# Patient Record
Sex: Female | Born: 1935 | Race: White | Hispanic: No | Smoking: Never smoker
Health system: Southern US, Community
[De-identification: ages and names within clinical notes are randomized; demographics above are authoritative.]

## PROBLEM LIST (undated history)

## (undated) DIAGNOSIS — K921 Melena: Secondary | ICD-10-CM

## (undated) DIAGNOSIS — I10 Essential (primary) hypertension: Secondary | ICD-10-CM

## (undated) DIAGNOSIS — M1990 Unspecified osteoarthritis, unspecified site: Secondary | ICD-10-CM

## (undated) DIAGNOSIS — K5792 Diverticulitis of intestine, part unspecified, without perforation or abscess without bleeding: Secondary | ICD-10-CM

## (undated) DIAGNOSIS — R32 Unspecified urinary incontinence: Secondary | ICD-10-CM

## (undated) DIAGNOSIS — K219 Gastro-esophageal reflux disease without esophagitis: Secondary | ICD-10-CM

## (undated) DIAGNOSIS — E079 Disorder of thyroid, unspecified: Secondary | ICD-10-CM

## (undated) HISTORY — PX: TONSILLECTOMY AND ADENOIDECTOMY: SUR1326

## (undated) HISTORY — DX: Gastro-esophageal reflux disease without esophagitis: K21.9

## (undated) HISTORY — PX: REPLACEMENT TOTAL KNEE: SUR1224

## (undated) HISTORY — DX: Essential (primary) hypertension: I10

## (undated) HISTORY — DX: Unspecified osteoarthritis, unspecified site: M19.90

## (undated) HISTORY — PX: CHOLECYSTECTOMY: SHX55

## (undated) HISTORY — DX: Disorder of thyroid, unspecified: E07.9

## (undated) HISTORY — PX: THYROID SURGERY: SHX805

## (undated) HISTORY — PX: APPENDECTOMY: SHX54

## (undated) HISTORY — PX: TUBAL LIGATION: SHX77

## (undated) HISTORY — DX: Melena: K92.1

## (undated) HISTORY — DX: Diverticulitis of intestine, part unspecified, without perforation or abscess without bleeding: K57.92

## (undated) HISTORY — DX: Unspecified urinary incontinence: R32

---

## 2014-10-05 HISTORY — PX: TOTAL SHOULDER REPLACEMENT: SUR1217

## 2015-10-09 ENCOUNTER — Ambulatory Visit (INDEPENDENT_AMBULATORY_CARE_PROVIDER_SITE_OTHER): Payer: Medicare Other | Admitting: Internal Medicine

## 2015-10-09 ENCOUNTER — Other Ambulatory Visit (INDEPENDENT_AMBULATORY_CARE_PROVIDER_SITE_OTHER): Payer: Medicare Other

## 2015-10-09 ENCOUNTER — Encounter: Payer: Self-pay | Admitting: Internal Medicine

## 2015-10-09 VITALS — BP 150/100 | HR 76 | Temp 98.3°F | Resp 16 | Ht 63.0 in | Wt 176.4 lb

## 2015-10-09 DIAGNOSIS — I10 Essential (primary) hypertension: Secondary | ICD-10-CM | POA: Diagnosis not present

## 2015-10-09 DIAGNOSIS — Z23 Encounter for immunization: Secondary | ICD-10-CM | POA: Diagnosis not present

## 2015-10-09 DIAGNOSIS — K219 Gastro-esophageal reflux disease without esophagitis: Secondary | ICD-10-CM

## 2015-10-09 DIAGNOSIS — E039 Hypothyroidism, unspecified: Secondary | ICD-10-CM

## 2015-10-09 LAB — COMPREHENSIVE METABOLIC PANEL
ALT: 18 U/L (ref 0–35)
AST: 18 U/L (ref 0–37)
Albumin: 4.1 g/dL (ref 3.5–5.2)
Alkaline Phosphatase: 67 U/L (ref 39–117)
BILIRUBIN TOTAL: 0.2 mg/dL (ref 0.2–1.2)
BUN: 20 mg/dL (ref 6–23)
CALCIUM: 9.2 mg/dL (ref 8.4–10.5)
CHLORIDE: 105 meq/L (ref 96–112)
CO2: 27 meq/L (ref 19–32)
Creatinine, Ser: 0.6 mg/dL (ref 0.40–1.20)
GFR: 102.69 mL/min (ref >60.00)
Glucose, Bld: 122 mg/dL — ABNORMAL HIGH (ref 70–99)
POTASSIUM: 3.8 meq/L (ref 3.5–5.1)
Sodium: 142 mEq/L (ref 135–145)
Total Protein: 6.5 g/dL (ref 6.0–8.3)

## 2015-10-09 LAB — TSH: TSH: 4.3 u[IU]/mL (ref 0.35–4.50)

## 2015-10-09 LAB — CBC
HEMATOCRIT: 39.7 % (ref 36.0–46.0)
Hemoglobin: 12.9 g/dL (ref 12.0–15.0)
MCHC: 32.6 g/dL (ref 30.0–36.0)
MCV: 87.8 fl (ref 78.0–100.0)
Platelets: 189 10*3/uL (ref 150.0–400.0)
RBC: 4.52 Mil/uL (ref 3.87–5.11)
RDW: 13.5 % (ref 11.5–15.5)
WBC: 7.3 10*3/uL (ref 4.0–10.5)

## 2015-10-09 LAB — HEMOGLOBIN A1C: Hgb A1c MFr Bld: 6 % (ref 4.6–6.5)

## 2015-10-09 LAB — T4, FREE: FREE T4: 0.92 ng/dL (ref 0.60–1.60)

## 2015-10-09 NOTE — Patient Instructions (Signed)
We will have you increase the amlodipine to 10 mg daily. We have sent in a new prescription but you can take 2 pills of the old until it is gone.   We will get the records and check some blood work today.   Health Maintenance, Female Adopting a healthy lifestyle and getting preventive care can go a long way to promote health and wellness. Talk with your health care provider about what schedule of regular examinations is right for you. This is a good chance for you to check in with your provider about disease prevention and staying healthy. In between checkups, there are plenty of things you can do on your own. Experts have done a lot of research about which lifestyle changes and preventive measures are most likely to keep you healthy. Ask your health care provider for more information. WEIGHT AND DIET  Eat a healthy diet  Be sure to include plenty of vegetables, fruits, low-fat dairy products, and lean protein.  Do not eat a lot of foods high in solid fats, added sugars, or salt.  Get regular exercise. This is one of the most important things you can do for your health.  Most adults should exercise for at least 150 minutes each week. The exercise should increase your heart rate and make you sweat (moderate-intensity exercise).  Most adults should also do strengthening exercises at least twice a week. This is in addition to the moderate-intensity exercise.  Maintain a healthy weight  Body mass index (BMI) is a measurement that can be used to identify possible weight problems. It estimates body fat based on height and weight. Your health care provider can help determine your BMI and help you achieve or maintain a healthy weight.  For females 24 years of age and older:   A BMI below 18.5 is considered underweight.  A BMI of 18.5 to 24.9 is normal.  A BMI of 25 to 29.9 is considered overweight.  A BMI of 30 and above is considered obese.  Watch levels of cholesterol and blood  lipids  You should start having your blood tested for lipids and cholesterol at 79 years of age, then have this test every 5 years.  You may need to have your cholesterol levels checked more often if:  Your lipid or cholesterol levels are high.  You are older than 79 years of age.  You are at high risk for heart disease.  CANCER SCREENING   Lung Cancer  Lung cancer screening is recommended for adults 15-60 years old who are at high risk for lung cancer because of a history of smoking.  A yearly low-dose CT scan of the lungs is recommended for people who:  Currently smoke.  Have quit within the past 15 years.  Have at least a 30-pack-year history of smoking. A pack year is smoking an average of one pack of cigarettes a day for 1 year.  Yearly screening should continue until it has been 15 years since you quit.  Yearly screening should stop if you develop a health problem that would prevent you from having lung cancer treatment.  Breast Cancer  Practice breast self-awareness. This means understanding how your breasts normally appear and feel.  It also means doing regular breast self-exams. Let your health care provider know about any changes, no matter how small.  If you are in your 20s or 30s, you should have a clinical breast exam (CBE) by a health care provider every 1-3 years as part of a  regular health exam.  If you are 40 or older, have a CBE every year. Also consider having a breast X-ray (mammogram) every year.  If you have a family history of breast cancer, talk to your health care provider about genetic screening.  If you are at high risk for breast cancer, talk to your health care provider about having an MRI and a mammogram every year.  Breast cancer gene (BRCA) assessment is recommended for women who have family members with BRCA-related cancers. BRCA-related cancers include:  Breast.  Ovarian.  Tubal.  Peritoneal cancers.  Results of the assessment  will determine the need for genetic counseling and BRCA1 and BRCA2 testing. Cervical Cancer Your health care provider may recommend that you be screened regularly for cancer of the pelvic organs (ovaries, uterus, and vagina). This screening involves a pelvic examination, including checking for microscopic changes to the surface of your cervix (Pap test). You may be encouraged to have this screening done every 3 years, beginning at age 84.  For women ages 99-65, health care providers may recommend pelvic exams and Pap testing every 3 years, or they may recommend the Pap and pelvic exam, combined with testing for human papilloma virus (HPV), every 5 years. Some types of HPV increase your risk of cervical cancer. Testing for HPV may also be done on women of any age with unclear Pap test results.  Other health care providers may not recommend any screening for nonpregnant women who are considered low risk for pelvic cancer and who do not have symptoms. Ask your health care provider if a screening pelvic exam is right for you.  If you have had past treatment for cervical cancer or a condition that could lead to cancer, you need Pap tests and screening for cancer for at least 20 years after your treatment. If Pap tests have been discontinued, your risk factors (such as having a new sexual partner) need to be reassessed to determine if screening should resume. Some women have medical problems that increase the chance of getting cervical cancer. In these cases, your health care provider may recommend more frequent screening and Pap tests. Colorectal Cancer  This type of cancer can be detected and often prevented.  Routine colorectal cancer screening usually begins at 79 years of age and continues through 79 years of age.  Your health care provider may recommend screening at an earlier age if you have risk factors for colon cancer.  Your health care provider may also recommend using home test kits to check  for hidden blood in the stool.  A small camera at the end of a tube can be used to examine your colon directly (sigmoidoscopy or colonoscopy). This is done to check for the earliest forms of colorectal cancer.  Routine screening usually begins at age 6.  Direct examination of the colon should be repeated every 5-10 years through 79 years of age. However, you may need to be screened more often if early forms of precancerous polyps or small growths are found. Skin Cancer  Check your skin from head to toe regularly.  Tell your health care provider about any new moles or changes in moles, especially if there is a change in a mole's shape or color.  Also tell your health care provider if you have a mole that is larger than the size of a pencil eraser.  Always use sunscreen. Apply sunscreen liberally and repeatedly throughout the day.  Protect yourself by wearing long sleeves, pants, a wide-brimmed hat,  and sunglasses whenever you are outside. HEART DISEASE, DIABETES, AND HIGH BLOOD PRESSURE   High blood pressure causes heart disease and increases the risk of stroke. High blood pressure is more likely to develop in:  People who have blood pressure in the high end of the normal range (130-139/85-89 mm Hg).  People who are overweight or obese.  People who are African American.  If you are 39-52 years of age, have your blood pressure checked every 3-5 years. If you are 19 years of age or older, have your blood pressure checked every year. You should have your blood pressure measured twice--once when you are at a hospital or clinic, and once when you are not at a hospital or clinic. Record the average of the two measurements. To check your blood pressure when you are not at a hospital or clinic, you can use:  An automated blood pressure machine at a pharmacy.  A home blood pressure monitor.  If you are between 31 years and 2 years old, ask your health care provider if you should take  aspirin to prevent strokes.  Have regular diabetes screenings. This involves taking a blood sample to check your fasting blood sugar level.  If you are at a normal weight and have a low risk for diabetes, have this test once every three years after 79 years of age.  If you are overweight and have a high risk for diabetes, consider being tested at a younger age or more often. PREVENTING INFECTION  Hepatitis B  If you have a higher risk for hepatitis B, you should be screened for this virus. You are considered at high risk for hepatitis B if:  You were born in a country where hepatitis B is common. Ask your health care provider which countries are considered high risk.  Your parents were born in a high-risk country, and you have not been immunized against hepatitis B (hepatitis B vaccine).  You have HIV or AIDS.  You use needles to inject street drugs.  You live with someone who has hepatitis B.  You have had sex with someone who has hepatitis B.  You get hemodialysis treatment.  You take certain medicines for conditions, including cancer, organ transplantation, and autoimmune conditions. Hepatitis C  Blood testing is recommended for:  Everyone born from [birth year] through 1963.  Anyone with known risk factors for hepatitis C. Sexually transmitted infections (STIs)  You should be screened for sexually transmitted infections (STIs) including gonorrhea and chlamydia if:  You are sexually active and are younger than 79 years of age.  You are older than 79 years of age and your health care provider tells you that you are at risk for this type of infection.  Your sexual activity has changed since you were last screened and you are at an increased risk for chlamydia or gonorrhea. Ask your health care provider if you are at risk.  If you do not have HIV, but are at risk, it may be recommended that you take a prescription medicine daily to prevent HIV infection. This is called  pre-exposure prophylaxis (PrEP). You are considered at risk if:  You are sexually active and do not regularly use condoms or know the HIV status of your partner(s).  You take drugs by injection.  You are sexually active with a partner who has HIV. Talk with your health care provider about whether you are at high risk of being infected with HIV. If you choose to begin PrEP, you should  first be tested for HIV. You should then be tested every 3 months for as long as you are taking PrEP.  PREGNANCY   If you are premenopausal and you may become pregnant, ask your health care provider about preconception counseling.  If you may become pregnant, take 400 to 800 micrograms (mcg) of folic acid every day.  If you want to prevent pregnancy, talk to your health care provider about birth control (contraception). OSTEOPOROSIS AND MENOPAUSE   Osteoporosis is a disease in which the bones lose minerals and strength with aging. This can result in serious bone fractures. Your risk for osteoporosis can be identified using a bone density scan.  If you are 2 years of age or older, or if you are at risk for osteoporosis and fractures, ask your health care provider if you should be screened.  Ask your health care provider whether you should take a calcium or vitamin D supplement to lower your risk for osteoporosis.  Menopause may have certain physical symptoms and risks.  Hormone replacement therapy may reduce some of these symptoms and risks. Talk to your health care provider about whether hormone replacement therapy is right for you.  HOME CARE INSTRUCTIONS   Schedule regular health, dental, and eye exams.  Stay current with your immunizations.   Do not use any tobacco products including cigarettes, chewing tobacco, or electronic cigarettes.  If you are pregnant, do not drink alcohol.  If you are breastfeeding, limit how much and how often you drink alcohol.  Limit alcohol intake to no more than 1  drink per day for nonpregnant women. One drink equals 12 ounces of beer, 5 ounces of wine, or 1 ounces of hard liquor.  Do not use street drugs.  Do not share needles.  Ask your health care provider for help if you need support or information about quitting drugs.  Tell your health care provider if you often feel depressed.  Tell your health care provider if you have ever been abused or do not feel safe at home.   This information is not intended to replace advice given to you by your health care provider. Make sure you discuss any questions you have with your health care provider.   Document Released: 04/06/2011 Document Revised: 10/12/2014 Document Reviewed: 08/23/2013 Elsevier Interactive Patient Education [employer].

## 2015-10-09 NOTE — Progress Notes (Signed)
Pre visit review using our clinic review tool, if applicable. No additional management support is needed unless otherwise documented below in the visit note. 

## 2015-10-11 DIAGNOSIS — I10 Essential (primary) hypertension: Secondary | ICD-10-CM | POA: Insufficient documentation

## 2015-10-11 DIAGNOSIS — K219 Gastro-esophageal reflux disease without esophagitis: Secondary | ICD-10-CM | POA: Insufficient documentation

## 2015-10-11 DIAGNOSIS — E039 Hypothyroidism, unspecified: Secondary | ICD-10-CM | POA: Insufficient documentation

## 2015-10-11 MED ORDER — AMLODIPINE BESYLATE 10 MG PO TABS: 10.0000 mg | ORAL_TABLET | Freq: Every day | ORAL | Status: DC

## 2015-10-11 NOTE — Assessment & Plan Note (Signed)
Takes omeprazole daily which is keeping her symptoms under good control. If she eats certain things she still gets symptoms.

## 2015-10-11 NOTE — Assessment & Plan Note (Signed)
BP not at goal, getting records from prior PCP. Checking CMP today and increasing amlodipine to 10 mg, continue metoprolol 50 mg BID, and ramipril 5 mg daily. See her back soon and adjust as needed.

## 2015-10-11 NOTE — Progress Notes (Signed)
   Subjective:    Patient ID: Ashlee Weeks, female    DOB: [birth year], 81 y.o.   MRN: [id number]  HPI The patient is a new 81 YO female coming in for her blood pressure. She has been seeing a doctor but recently moved to the area. Denies headaches, chest pains, SOB. She denies any side effects from the medicines. Not sure if she has any complications.   PMH, [facility], social history reviewed and updated.   Review of Systems  Constitutional: Negative for fever, activity change, appetite change, fatigue and unexpected weight change.  HENT: Negative.   Eyes: Negative.   Respiratory: Negative for cough, chest tightness, shortness of breath and wheezing.   Cardiovascular: Negative for chest pain, palpitations and leg swelling.  Gastrointestinal: Negative for nausea, abdominal pain, diarrhea, constipation and abdominal distention.  Musculoskeletal: Negative.   Skin: Negative.   Neurological: Negative.   Psychiatric/Behavioral: Negative.       Objective:   Physical Exam  Constitutional: She is oriented to person, place, and time. She appears well-developed and well-nourished.  HENT:  Head: Normocephalic and atraumatic.  Eyes: EOM are normal.  Neck: Normal range of motion.  Cardiovascular: Normal rate and regular rhythm.   Pulmonary/Chest: Effort normal and breath sounds normal. No respiratory distress. She has no wheezes. She has no rales.  Abdominal: Soft. Bowel sounds are normal. She exhibits no distension. There is no tenderness. There is no rebound.  Musculoskeletal: She exhibits no edema.  Neurological: She is alert and oriented to person, place, and time. Coordination normal.  Skin: Skin is warm and dry.  Psychiatric: She has a normal mood and affect.   Filed Vitals:   10/09/15 1031  BP: 150/100  Pulse: 76  Temp: 98.3 F (36.8 C)  TempSrc: Oral  Resp: 16  Height: 5\' 3"  (1.6 m)  Weight: 176 lb 6.4 oz (80.015 kg)  SpO2: 97%      Assessment & Plan:  Given flu shot and  prevnar 13 at visit.

## 2015-10-11 NOTE — Assessment & Plan Note (Signed)
Had lobectomy for benign cyst in the past. Taking synthroid 25 mcg daily. Checking TSH and free T4 and adjust as needed.

## 2015-11-15 ENCOUNTER — Telehealth: Payer: Self-pay | Admitting: *Deleted

## 2015-11-15 MED ORDER — METOPROLOL TARTRATE 50 MG PO TABS: 50.0000 mg | ORAL_TABLET | Freq: Two times a day (BID) | ORAL | Status: DC

## 2015-11-15 NOTE — Telephone Encounter (Signed)
Receive call pt states she is needing refill on her metoprolol. verified pharmacy inform pt will send to [place]...Johny Chess

## 2015-12-06 ENCOUNTER — Other Ambulatory Visit: Payer: Self-pay | Admitting: Internal Medicine

## 2015-12-18 ENCOUNTER — Telehealth: Payer: Self-pay | Admitting: Internal Medicine

## 2015-12-18 NOTE — Telephone Encounter (Signed)
Received records from [facility] forwarded 36 pages to Dr. Pricilla Holm 12/18/15 fbg.

## 2016-01-07 HISTORY — PX: EYE SURGERY: SHX253

## 2016-01-23 HISTORY — PX: EYE SURGERY: SHX253

## 2016-01-30 ENCOUNTER — Encounter: Payer: Self-pay | Admitting: Internal Medicine

## 2016-01-30 ENCOUNTER — Ambulatory Visit (INDEPENDENT_AMBULATORY_CARE_PROVIDER_SITE_OTHER): Payer: Medicare Other | Admitting: Internal Medicine

## 2016-01-30 VITALS — BP 132/78 | HR 88 | Temp 98.5°F | Resp 20 | Ht 63.0 in | Wt 175.0 lb

## 2016-01-30 DIAGNOSIS — Z23 Encounter for immunization: Secondary | ICD-10-CM

## 2016-01-30 DIAGNOSIS — I10 Essential (primary) hypertension: Secondary | ICD-10-CM | POA: Diagnosis not present

## 2016-01-30 MED ORDER — OMEPRAZOLE 20 MG PO CPDR
20.0000 mg | DELAYED_RELEASE_CAPSULE | Freq: Every day | ORAL | Status: DC
Start: 2016-01-30 — End: 2017-02-04

## 2016-01-30 NOTE — Progress Notes (Signed)
Pre visit review using our clinic review tool, if applicable. No additional management support is needed unless otherwise documented below in the visit note. 

## 2016-01-30 NOTE — Patient Instructions (Signed)
We have given you the tetanus shot today and do not need blood work.   Come back in about 6 months for a follow up and please feel free to call us with problems or questions before then.

## 2016-01-30 NOTE — Assessment & Plan Note (Signed)
BP at goal now on amlodipine 10 mg daily, metoprolol 50 mg BID and ramipril 5 mg BID. No indication to recheck BMP today. Advised to continue.

## 2016-01-30 NOTE — Progress Notes (Signed)
   Subjective:    Patient ID: Ashlee Weeks, female    DOB: 1935-05-08, 81 y.o.   MRN: [id number]  HPI The patient is a 81 YO female coming in for follow up of her blood pressure. Increased her amlodipine to 10 mg at last visit. She has made that change without problems. Some mild ankle swelling which does not bother her. No headaches, chest pains, SOB. BPs are better at home. No new problems.   Review of Systems  Constitutional: Negative for fever, activity change, appetite change, fatigue and unexpected weight change.  Respiratory: Negative for cough, chest tightness, shortness of breath and wheezing.   Cardiovascular: Negative for chest pain, palpitations and leg swelling.  Gastrointestinal: Negative for nausea, abdominal pain, diarrhea, constipation and abdominal distention.  Musculoskeletal: Negative.   Skin: Negative.   Neurological: Negative.   Psychiatric/Behavioral: Negative.       Objective:   Physical Exam  Constitutional: She is oriented to person, place, and time. She appears well-developed and well-nourished.  HENT:  Head: Normocephalic and atraumatic.  Eyes: EOM are normal.  Neck: Normal range of motion.  Cardiovascular: Normal rate and regular rhythm.   Pulmonary/Chest: Effort normal and breath sounds normal. No respiratory distress. She has no wheezes. She has no rales.  Abdominal: Soft. She exhibits no distension. There is no tenderness. There is no rebound.  Musculoskeletal: She exhibits no edema.  Neurological: She is alert and oriented to person, place, and time. Coordination normal.  Skin: Skin is warm and dry.   Filed Vitals:   01/30/16 1312  BP: 132/78  Pulse: 88  Temp: 98.5 F (36.9 C)  TempSrc: Oral  Resp: 20  Height: 5\' 3"  (1.6 m)  Weight: 175 lb (79.379 kg)  SpO2: 92%      Assessment & Plan:  Tdap given at visit.

## 2016-02-06 ENCOUNTER — Ambulatory Visit: Payer: Medicare Other | Admitting: Internal Medicine

## 2016-05-14 ENCOUNTER — Other Ambulatory Visit: Payer: Self-pay | Admitting: Internal Medicine

## 2016-07-30 ENCOUNTER — Encounter: Payer: Self-pay | Admitting: Internal Medicine

## 2016-07-30 ENCOUNTER — Other Ambulatory Visit (INDEPENDENT_AMBULATORY_CARE_PROVIDER_SITE_OTHER): Payer: Medicare Other

## 2016-07-30 ENCOUNTER — Ambulatory Visit (INDEPENDENT_AMBULATORY_CARE_PROVIDER_SITE_OTHER): Payer: Medicare Other | Admitting: Internal Medicine

## 2016-07-30 VITALS — BP 164/94 | HR 86 | Temp 99.0°F | Resp 18 | Ht 63.0 in | Wt 182.8 lb

## 2016-07-30 DIAGNOSIS — Z23 Encounter for immunization: Secondary | ICD-10-CM

## 2016-07-30 DIAGNOSIS — E039 Hypothyroidism, unspecified: Secondary | ICD-10-CM

## 2016-07-30 DIAGNOSIS — I10 Essential (primary) hypertension: Secondary | ICD-10-CM | POA: Diagnosis not present

## 2016-07-30 LAB — TSH: TSH: 2.64 u[IU]/mL (ref 0.35–4.50)

## 2016-07-30 NOTE — Progress Notes (Signed)
   Subjective:    Patient ID: Ashlee Weeks, female    DOB: [birth year], 81 y.o.   MRN: [id number]  HPI The patient is a 81 YO female coming in for follow up of her blood pressure (she is taking amlodipine and metoprolol and ramipril, usually at goal). She did not take her meds today as she thought we may need labs. She did not eat this morning. Some increase in her sodium intake recently with more sandwiches and soups. She denies headaches or chest pains or abdominal pain. No otc pain meds or cold medications recently.   Review of Systems  Constitutional: Negative.   Respiratory: Negative.   Cardiovascular: Negative.   Gastrointestinal: Negative.   Musculoskeletal: Positive for arthralgias. Negative for gait problem and myalgias.  Skin: Negative.   Neurological: Negative.       Objective:   Physical Exam  Constitutional: She is oriented to person, place, and time. She appears well-developed and well-nourished.  HENT:  Head: Normocephalic and atraumatic.  Eyes: EOM are normal.  Cardiovascular: Normal rate and regular rhythm.   Pulmonary/Chest: Effort normal. No respiratory distress. She has no wheezes. She has no rales.  Abdominal: Soft. She exhibits no distension. There is no tenderness. There is no rebound.  Neurological: She is alert and oriented to person, place, and time.  Skin: Skin is warm.   Vitals:   07/30/16 1057 07/30/16 1126  BP: (!) 158/88 (!) 164/94  Pulse: 86   Resp: 18   Temp: 99 F (37.2 C)   TempSrc: Oral   SpO2: 95%   Weight: 182 lb 12.8 oz (82.9 kg)   Height: 5\' 3"  (1.6 m)       Assessment & Plan:  Flu shot given at visit.

## 2016-07-30 NOTE — Patient Instructions (Signed)
We have given you the flu shot today.   We are checking the thyroid levels and will call you back about the results.   Try some metamucil to help with the bowels.

## 2016-07-30 NOTE — Progress Notes (Signed)
Pre visit review using our clinic review tool, if applicable. No additional management support is needed unless otherwise documented below in the visit note. 

## 2016-08-02 NOTE — Assessment & Plan Note (Signed)
BP is mildly elevated today which is unusual. Talked to her about dash diet and decreasing sodium intake. She will take her meds before coming next visit. Adjust as needed then. Taking amlodipine, ramipril and metoprolol. Dose adjustment on the ramipril could be done next visit.

## 2016-08-02 NOTE — Assessment & Plan Note (Signed)
Checking TSH and adjust as needed. Taking synthroid 25 mcg daily and do not want over replacement.

## 2016-08-18 ENCOUNTER — Other Ambulatory Visit: Payer: Self-pay | Admitting: Internal Medicine

## 2016-08-19 ENCOUNTER — Other Ambulatory Visit: Payer: Self-pay | Admitting: Internal Medicine

## 2016-10-13 ENCOUNTER — Other Ambulatory Visit: Payer: Self-pay | Admitting: Internal Medicine

## 2016-11-09 ENCOUNTER — Other Ambulatory Visit: Payer: Self-pay | Admitting: Internal Medicine

## 2016-11-16 ENCOUNTER — Other Ambulatory Visit: Payer: Self-pay | Admitting: Internal Medicine

## 2016-11-18 ENCOUNTER — Other Ambulatory Visit: Payer: Self-pay | Admitting: *Deleted

## 2016-11-18 MED ORDER — LEVOTHYROXINE SODIUM 25 MCG PO TABS: 25.0000 ug | ORAL_TABLET | Freq: Every day | ORAL | 2 refills | Status: DC

## 2017-01-19 ENCOUNTER — Other Ambulatory Visit: Payer: Self-pay | Admitting: Internal Medicine

## 2017-02-04 ENCOUNTER — Other Ambulatory Visit: Payer: Self-pay | Admitting: Internal Medicine

## 2017-02-14 ENCOUNTER — Other Ambulatory Visit: Payer: Self-pay | Admitting: Internal Medicine

## 2017-03-08 ENCOUNTER — Encounter: Payer: Self-pay | Admitting: Internal Medicine

## 2017-03-08 ENCOUNTER — Ambulatory Visit (INDEPENDENT_AMBULATORY_CARE_PROVIDER_SITE_OTHER): Payer: Medicare Other | Admitting: Internal Medicine

## 2017-03-08 VITALS — BP 170/92 | HR 93 | Temp 98.8°F | Resp 14 | Ht 63.0 in | Wt 180.0 lb

## 2017-03-08 DIAGNOSIS — I10 Essential (primary) hypertension: Secondary | ICD-10-CM | POA: Diagnosis not present

## 2017-03-08 MED ORDER — RAMIPRIL 10 MG PO CAPS: 10.0000 mg | ORAL_CAPSULE | Freq: Two times a day (BID) | ORAL | 3 refills | Status: DC

## 2017-03-08 NOTE — Progress Notes (Signed)
   Subjective:    Patient ID: Ashlee Weeks, female    DOB: 1935/07/29, 82 y.o.   MRN: [id number]  HPI The patient is a 82 YO female coming in for dizziness in the morning for several days with blood pressure running higher than it should for several months. She has been having some mildly elevated readings to 150s/90s a lot over the last several months. In the last week she has been eating more salty foods and her BP in the morning has been in the 170s mostly. She felt dizzy with standing for a few minutes. Then okay most of the day. Denies headaches, chest pains, fevers or chills, abdominal pain, nausea or vomiting. Not drinking as much water lately as she should. Has had vertigo in the past and it did not feel like this. Felt more like she might pass out although she denies falls or syncope.   Review of Systems  Constitutional: Positive for appetite change. Negative for activity change, diaphoresis, fatigue and unexpected weight change.  Respiratory: Negative.   Cardiovascular: Negative.   Gastrointestinal: Negative.   Musculoskeletal: Negative.   Neurological: Positive for light-headedness. Negative for dizziness.      Objective:   Physical Exam  Constitutional: She is oriented to person, place, and time. She appears well-developed and well-nourished.  HENT:  Head: Normocephalic and atraumatic.  Eyes: EOM are normal.  Neck: Normal range of motion.  Cardiovascular: Normal rate and regular rhythm.   Pulmonary/Chest: Effort normal and breath sounds normal.  Abdominal: Soft.  Musculoskeletal: She exhibits no edema.  Neurological: She is alert and oriented to person, place, and time. No cranial nerve deficit. Coordination normal.  No orthostatic symptoms with standing.   Skin: Skin is warm and dry.   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

## 2017-03-08 NOTE — Patient Instructions (Addendum)
The EKG of the heart is not changed from before.   We should get the blood pressure down some to help decrease the risk of heart attack and stroke.   We will increase the ramipril to 10 mg in the morning and 10 mg in the evening to see if this helps with the dizziness. You can take 2 pills of the current ramipril until it is gone and then I have sent in the higher dose to the pharmacy.   We will have you come back in about 1 month for physical and follow up to check on the blood pressure and check the labs.

## 2017-03-10 NOTE — Assessment & Plan Note (Signed)
EKG done in office due to symptoms and high BP which is negative for acute changes. Increase ramipril to 20 mg daily, continue metoprolol 50 mg BID, amlodipine 10 mg daily.

## 2017-03-14 ENCOUNTER — Other Ambulatory Visit: Payer: Self-pay | Admitting: Internal Medicine

## 2017-03-29 ENCOUNTER — Encounter: Payer: Self-pay | Admitting: Nurse Practitioner

## 2017-03-29 ENCOUNTER — Other Ambulatory Visit (INDEPENDENT_AMBULATORY_CARE_PROVIDER_SITE_OTHER): Payer: Medicare Other

## 2017-03-29 ENCOUNTER — Ambulatory Visit (INDEPENDENT_AMBULATORY_CARE_PROVIDER_SITE_OTHER): Payer: Medicare Other | Admitting: Nurse Practitioner

## 2017-03-29 VITALS — BP 140/76 | HR 91 | Temp 98.2°F | Ht 63.0 in | Wt 182.0 lb

## 2017-03-29 DIAGNOSIS — M810 Age-related osteoporosis without current pathological fracture: Secondary | ICD-10-CM | POA: Diagnosis not present

## 2017-03-29 DIAGNOSIS — I10 Essential (primary) hypertension: Secondary | ICD-10-CM | POA: Diagnosis not present

## 2017-03-29 DIAGNOSIS — K219 Gastro-esophageal reflux disease without esophagitis: Secondary | ICD-10-CM

## 2017-03-29 DIAGNOSIS — E039 Hypothyroidism, unspecified: Secondary | ICD-10-CM

## 2017-03-29 LAB — TSH: TSH: 3.51 u[IU]/mL (ref 0.35–4.50)

## 2017-03-29 LAB — T4, FREE: FREE T4: 0.94 ng/dL (ref 0.60–1.60)

## 2017-03-29 LAB — BASIC METABOLIC PANEL
BUN: 19 mg/dL (ref 6–23)
CALCIUM: 9.5 mg/dL (ref 8.4–10.5)
CO2: 28 mEq/L (ref 19–32)
CREATININE: 0.67 mg/dL (ref 0.40–1.20)
Chloride: 105 mEq/L (ref 96–112)
GFR: 90.07 mL/min (ref >60.00)
GLUCOSE: 154 mg/dL — AB (ref 70–99)
Potassium: 4 mEq/L (ref 3.5–5.1)
Sodium: 141 mEq/L (ref 135–145)

## 2017-03-29 MED ORDER — OMEPRAZOLE 20 MG PO CPDR: 20.0000 mg | DELAYED_RELEASE_CAPSULE | Freq: Every day | ORAL | 3 refills | Status: DC

## 2017-03-29 MED ORDER — LEVOTHYROXINE SODIUM 25 MCG PO TABS: 25.0000 ug | ORAL_TABLET | Freq: Every day | ORAL | 3 refills | Status: DC

## 2017-03-29 MED ORDER — METOPROLOL TARTRATE 50 MG PO TABS: 50.0000 mg | ORAL_TABLET | Freq: Two times a day (BID) | ORAL | 1 refills | Status: DC

## 2017-03-29 NOTE — Patient Instructions (Addendum)
Go to basement for blood draw.  Will review lab prior to refilling levothyroxine.  Make appt for bone density scan. Consider changing from evista to prolia if dexa scan indicates no improvement in bone density.

## 2017-03-29 NOTE — Progress Notes (Signed)
Subjective:  Patient ID: Ashlee Weeks, female    DOB: 12-Nov-1934  Age: 82 y.o. MRN: [id number]  CC: Follow-up (HTN and thyroid function)   HPI Declined CPE today.  HTN: Improved with amlodipine, metoprolol, and ramipril.  Hypothyroidism: Stable.  Osteoporosis: Current use of evista No adverse side effects. Use of fosamax in past, caused joint and muscle pain.  Outpatient Medications Prior to Visit  Medication Sig Dispense Refill  . amLODipine (NORVASC) 10 MG tablet TAKE 1 TABLET BY MOUTH EVERY DAY 90 tablet 3  . aspirin EC 81 MG tablet Take 81 mg by mouth daily.    . Calcium Carbonate-Vitamin D (CALTRATE 600+D PO) Take by mouth.    . Cholecalciferol (VITAMIN D-3) 1000 units CAPS Take by mouth. Take one by mouth twice daily    . clindamycin (CLEOCIN T) 1 % lotion Apply topically daily.     . cyanocobalamin 1000 MCG tablet Take 100 mcg by mouth daily.    Marland Kitchen doxycycline (VIBRA-TABS) 100 MG tablet Take 50 mg by mouth daily.     Marland Kitchen ibuprofen (ADVIL,MOTRIN) 200 MG tablet Take 200 mg by mouth every 6 (six) hours as needed.    . Multiple Vitamins-Minerals (MULTIVITAMIN WITH MINERALS) tablet Take 1 tablet by mouth daily.    . raloxifene (EVISTA) 60 MG tablet TAKE 1 TABLET BY MOUTH DAILY 90 tablet 2  . ramipril (ALTACE) 10 MG capsule Take 1 capsule (10 mg total) by mouth 2 (two) times daily. 60 capsule 3  . levothyroxine (SYNTHROID, LEVOTHROID) 25 MCG tablet Take 1 tablet (25 mcg total) by mouth daily before breakfast. 90 tablet 2  . metoprolol (LOPRESSOR) 50 MG tablet TAKE 1 TABLET BY MOUTH TWICE A DAY 180 tablet 1  . omeprazole (PRILOSEC) 20 MG capsule TAKE 1 CAPSULE (20 MG TOTAL) BY MOUTH DAILY. 90 capsule 1   No facility-administered medications prior to visit.     ROS See HPI  Objective:  BP 140/76   Pulse 91   Temp 98.2 F (36.8 C)   Ht 5\' 3"  (1.6 m)   Wt 182 lb (82.6 kg)   SpO2 96%   BMI 32.24 kg/m   BP Readings from Last 3 Encounters:  03/29/17 140/76  03/08/17  (!) 170/92  07/30/16 (!) 164/94    Wt Readings from Last 3 Encounters:  03/29/17 182 lb (82.6 kg)  03/08/17 180 lb (81.6 kg)  07/30/16 182 lb 12.8 oz (82.9 kg)    Physical Exam  Constitutional: She is oriented to person, place, and time. No distress.  HENT:  Right Ear: External ear normal.  Left Ear: External ear normal.  Nose: Nose normal.  Mouth/Throat: No oropharyngeal exudate.  Neck: Normal range of motion. Neck supple. No thyromegaly present.  Cardiovascular: Normal rate, regular rhythm and normal heart sounds.   Pulmonary/Chest: Effort normal and breath sounds normal. No respiratory distress.  Abdominal: Soft. She exhibits no distension.  Musculoskeletal: Normal range of motion. She exhibits no edema.  Lymphadenopathy:    She has no cervical adenopathy.  Neurological: She is alert and oriented to person, place, and time.  Skin: Skin is warm and dry.  Psychiatric: She has a normal mood and affect. Her behavior is normal.  Vitals reviewed.   Lab Results  Component Value Date   WBC 7.3 10/09/2015   HGB 12.9 10/09/2015   HCT 39.7 10/09/2015   PLT 189.0 10/09/2015   GLUCOSE 154 (H) 03/29/2017   ALT 18 10/09/2015   AST 18 10/09/2015   NA  141 03/29/2017   K 4.0 03/29/2017   CL 105 03/29/2017   CREATININE 0.67 03/29/2017   BUN 19 03/29/2017   CO2 28 03/29/2017   TSH 3.51 03/29/2017   HGBA1C 6.0 10/09/2015    Patient was never admitted.  Assessment & Plan:   Naysha was seen today for follow-up.  Diagnoses and all orders for this visit:  Essential hypertension -     Basic metabolic panel; Future -     metoprolol tartrate (LOPRESSOR) 50 MG tablet; Take 1 tablet (50 mg total) by mouth 2 (two) times daily. -     DG Bone Density; Future  Gastroesophageal reflux disease, esophagitis presence not specified -     omeprazole (PRILOSEC) 20 MG capsule; Take 1 capsule (20 mg total) by mouth daily.  Hypothyroidism, unspecified type -     TSH; Future -     T4, free;  Future -     levothyroxine (SYNTHROID, LEVOTHROID) 25 MCG tablet; Take 1 tablet (25 mcg total) by mouth daily before breakfast.  Age-related osteoporosis without current pathological fracture -     DG Bone Density; Future   I have changed Ms. Flori's metoprolol tartrate. I am also having her maintain her Vitamin D-3, cyanocobalamin, aspirin EC, multivitamin with minerals, Calcium Carbonate-Vitamin D (CALTRATE 600+D PO), ibuprofen, doxycycline, clindamycin, amLODipine, ramipril, raloxifene, levothyroxine, and omeprazole.  Meds ordered this encounter  Medications  . metoprolol tartrate (LOPRESSOR) 50 MG tablet    Sig: Take 1 tablet (50 mg total) by mouth 2 (two) times daily.    Dispense:  180 tablet    Refill:  1    Order Specific Question:   Supervising Provider    Answer:   Cassandria Anger [[id number]]  . levothyroxine (SYNTHROID, LEVOTHROID) 25 MCG tablet    Sig: Take 1 tablet (25 mcg total) by mouth daily before breakfast.    Dispense:  90 tablet    Refill:  3    Order Specific Question:   Supervising Provider    Answer:   Cassandria Anger [[id number]]  . omeprazole (PRILOSEC) 20 MG capsule    Sig: Take 1 capsule (20 mg total) by mouth daily.    Dispense:  90 capsule    Refill:  3    Order Specific Question:   Supervising Provider    Answer:   Cassandria Anger [[id number]]    Follow-up: Return in about 6 months (around 09/28/2017) for HTN and hypothyroidism.  Wilfred Lacy, NP

## 2017-04-08 ENCOUNTER — Inpatient Hospital Stay: Admission: RE | Admit: 2017-04-08 | Payer: Medicare Other | Source: Ambulatory Visit

## 2017-06-28 ENCOUNTER — Other Ambulatory Visit: Payer: Self-pay | Admitting: Internal Medicine

## 2017-07-16 ENCOUNTER — Ambulatory Visit (INDEPENDENT_AMBULATORY_CARE_PROVIDER_SITE_OTHER): Payer: Medicare Other | Admitting: *Deleted

## 2017-07-16 VITALS — BP 134/80 | HR 74 | Resp 20 | Ht 63.0 in | Wt 176.0 lb

## 2017-07-16 DIAGNOSIS — E2839 Other primary ovarian failure: Secondary | ICD-10-CM | POA: Diagnosis not present

## 2017-07-16 DIAGNOSIS — Z0000 Encounter for general adult medical examination without abnormal findings: Secondary | ICD-10-CM | POA: Diagnosis not present

## 2017-07-16 DIAGNOSIS — Z23 Encounter for immunization: Secondary | ICD-10-CM | POA: Diagnosis not present

## 2017-07-16 NOTE — Patient Instructions (Signed)
Continue doing brain stimulating activities (puzzles, reading, adult coloring books, staying active) to keep memory sharp.   Continue to eat heart healthy diet (full of fruits, vegetables, whole grains, lean protein, water--limit salt, fat, and sugar intake) and increase physical activity as tolerated.   Ashlee Weeks , Thank you for taking time to come for your Medicare Wellness Visit. I appreciate your ongoing commitment to your health goals. Please review the following plan we discussed and let me know if I can assist you in the future.   These are the goals we discussed: Goals    . Stay as healthy as I can          Continue to walk, eat healthy, stay informed, enjoy life and family,        This is a list of the screening recommended for you and due dates:  Health Maintenance  Topic Date Due  . DEXA scan (bone density measurement)  06/28/2002  . Flu Shot  05/05/2017  . Tetanus Vaccine  01/29/2026  . Pneumonia vaccines  Completed   Influenza Virus Vaccine injection What is this medicine? INFLUENZA VIRUS VACCINE (in floo EN zuh VAHY ruhs vak SEEN) helps to reduce the risk of getting influenza also known as the flu. The vaccine only helps protect you against some strains of the flu. This medicine may be used for other purposes; ask your health care provider or pharmacist if you have questions. COMMON BRAND NAME(S): Afluria, Agriflu, Alfuria, FLUAD, Fluarix, Fluarix Quadrivalent, Flublok, Flublok Quadrivalent, FLUCELVAX, Flulaval, Fluvirin, Fluzone, Fluzone High-Dose, Fluzone Intradermal What should I tell my health care provider before I take this medicine? They need to know if you have any of these conditions: -bleeding disorder like hemophilia -fever or infection -Guillain-Barre syndrome or other neurological problems -immune system problems -infection with the human immunodeficiency virus (HIV) or AIDS -low blood platelet counts -multiple sclerosis -an unusual or allergic  reaction to influenza virus vaccine, latex, other medicines, foods, dyes, or preservatives. Different brands of vaccines contain different allergens. Some may contain latex or eggs. Talk to your doctor about your allergies to make sure that you get the right vaccine. -pregnant or trying to get pregnant -breast-feeding How should I use this medicine? This vaccine is for injection into a muscle or under the skin. It is given by a health care professional. A copy of Vaccine Information Statements will be given before each vaccination. Read this sheet carefully each time. The sheet may change frequently. Talk to your healthcare provider to see which vaccines are right for you. Some vaccines should not be used in all age groups. Overdosage: If you think you have taken too much of this medicine contact a poison control center or emergency room at once. NOTE: This medicine is only for you. Do not share this medicine with others. What if I miss a dose? This does not apply. What may interact with this medicine? -chemotherapy or radiation therapy -medicines that lower your immune system like etanercept, anakinra, infliximab, and adalimumab -medicines that treat or prevent blood clots like warfarin -phenytoin -steroid medicines like prednisone or cortisone -theophylline -vaccines This list may not describe all possible interactions. Give your health care provider a list of all the medicines, herbs, non-prescription drugs, or dietary supplements you use. Also tell them if you smoke, drink alcohol, or use illegal drugs. Some items may interact with your medicine. What should I watch for while using this medicine? Report any side effects that do not go away within 3  days to your doctor or health care professional. Call your health care provider if any unusual symptoms occur within 6 weeks of receiving this vaccine. You may still catch the flu, but the illness is not usually as bad. You cannot get the flu  from the vaccine. The vaccine will not protect against colds or other illnesses that may cause fever. The vaccine is needed every year. What side effects may I notice from receiving this medicine? Side effects that you should report to your doctor or health care professional as soon as possible: -allergic reactions like skin rash, itching or hives, swelling of the face, lips, or tongue Side effects that usually do not require medical attention (report to your doctor or health care professional if they continue or are bothersome): -fever -headache -muscle aches and pains -pain, tenderness, redness, or swelling at the injection site -tiredness This list may not describe all possible side effects. Call your doctor for medical advice about side effects. You may report side effects to FDA at 1-800-FDA-1088. Where should I keep my medicine? The vaccine will be given by a health care professional in a clinic, pharmacy, doctor's office, or other health care setting. You will not be given vaccine doses to store at home. NOTE: This sheet is a summary. It may not cover all possible information. If you have questions about this medicine, talk to your doctor, pharmacist, or health care provider.  2018 Elsevier/Gold Standard (2015-04-12 10:07:28)

## 2017-07-16 NOTE — Progress Notes (Signed)
Pre visit review using our clinic review tool, if applicable. No additional management support is needed unless otherwise documented below in the visit note. 

## 2017-07-16 NOTE — Progress Notes (Signed)
Subjective:   Ashlee Weeks is a 82 y.o. female who presents for an Initial Medicare Annual Wellness Visit.  Review of Systems    No ROS.  Medicare Wellness Visit. Additional risk factors are reflected in the social history.   Cardiac Risk Factors include: advanced age (>79men, >35 women);hypertension Sleep patterns: feels rested on waking, gets up 1-2 times nightly to void and sleeps 6-7 hours nightly.    Home Safety/Smoke Alarms: Feels safe in home. Smoke alarms in place.  Living environment; residence and Firearm Safety: 1-story house/ trailer, no firearms. Lives alone, no needs for DME, good support system Seat Belt Safety/Bike Helmet: Wears seat belt.    Objective:    Today's Vitals   07/16/17 1507  BP: 134/80  Pulse: 74  Resp: 20  SpO2: 96%  Weight: 176 lb (79.8 kg)  Height: 5\' 3"  (1.6 m)   Body mass index is 31.18 kg/m.   Current Medications (verified) Outpatient Encounter Prescriptions as of 07/16/2017  Medication Sig  . amLODipine (NORVASC) 10 MG tablet TAKE 1 TABLET BY MOUTH EVERY DAY  . aspirin EC 81 MG tablet Take 81 mg by mouth daily.  . Calcium Carbonate-Vitamin D (CALTRATE 600+D PO) Take by mouth.   . Cholecalciferol (VITAMIN D-3) 1000 units CAPS Take by mouth. Take one by mouth twice daily  . clindamycin (CLEOCIN T) 1 % lotion Apply topically daily.   . cyanocobalamin 1000 MCG tablet Take 100 mcg by mouth daily.  Marland Kitchen doxycycline (VIBRA-TABS) 100 MG tablet Take 50 mg by mouth daily.   Marland Kitchen ibuprofen (ADVIL,MOTRIN) 200 MG tablet Take 200 mg by mouth every 6 (six) hours as needed.  Marland Kitchen levothyroxine (SYNTHROID, LEVOTHROID) 25 MCG tablet Take 1 tablet (25 mcg total) by mouth daily before breakfast.  . metoprolol tartrate (LOPRESSOR) 50 MG tablet Take 1 tablet (50 mg total) by mouth 2 (two) times daily.  . Multiple Vitamins-Minerals (MULTIVITAMIN WITH MINERALS) tablet Take 1 tablet by mouth daily.  Marland Kitchen omeprazole (PRILOSEC) 20 MG capsule Take 1 capsule (20 mg total)  by mouth daily.  . raloxifene (EVISTA) 60 MG tablet TAKE 1 TABLET BY MOUTH DAILY  . ramipril (ALTACE) 10 MG capsule TAKE ONE CAPSULE BY MOUTH TWICE A DAY   No facility-administered encounter medications on file as of 07/16/2017.     Allergies (verified) Fosamax [alendronate sodium]   History: Past Medical History:  Diagnosis Date  . Arthritis   . Blood in stool   . Diverticulitis   . GERD (gastroesophageal reflux disease)   . Hypertension   . Thyroid disease   . Urine incontinence    Past Surgical History:  Procedure Laterality Date  . APPENDECTOMY    . CHOLECYSTECTOMY    . EYE SURGERY Right 01/07/2016   artificial lens implant  . EYE SURGERY Left 01/23/2016   artificial lens implant  . REPLACEMENT TOTAL KNEE Bilateral   . THYROID SURGERY     1/2 of thyroid removed  . TONSILLECTOMY AND ADENOIDECTOMY    . TOTAL SHOULDER REPLACEMENT Left 2016  . TUBAL LIGATION     Family History  Problem Relation Age of Onset  . Hypertension Mother   . Heart disease Father   . Hypertension Maternal Aunt   . Miscarriages / Stillbirths Maternal Aunt   . Arthritis Maternal Grandmother   . Alcohol abuse Paternal Grandmother    Social History   Occupational History  . Not on file.   Social History Main Topics  . Smoking status: Never Smoker  .  Smokeless tobacco: Never Used  . Alcohol use No  . Drug use: No  . Sexual activity: Not on file    Tobacco Counseling Counseling given: Not Answered   Activities of Daily Living In your present state of health, do you have any difficulty performing the following activities: 07/16/2017  Hearing? N  Vision? N  Difficulty concentrating or making decisions? N  Walking or climbing stairs? N  Dressing or bathing? N  Doing errands, shopping? N  Preparing Food and eating ? N  Using the Toilet? N  In the past six months, have you accidently leaked urine? Y  Do you have problems with loss of bowel control? N  Managing your Medications? N   Managing your Finances? N  Housekeeping or managing your Housekeeping? N  Some recent data might be hidden    Immunizations and Health Maintenance Immunization History  Administered Date(s) Administered  . Influenza, High Dose Seasonal PF 07/30/2016  . Influenza,inj,Quad PF,6+ Mos 10/09/2015  . Pneumococcal Conjugate-13 10/09/2015  . Pneumococcal Polysaccharide-23 10/05/2004  . Tdap 01/30/2016   Health Maintenance Due  Topic Date Due  . DEXA SCAN  06/28/2002  . INFLUENZA VACCINE  05/05/2017    Patient Care Team: Hoyt Koch, MD as PCP - General (Internal Medicine)  Indicate any recent Medical Services you may have received from other than [facility] providers in the past year (date may be approximate).     Assessment:   This is a routine wellness examination for Erisa. Physical assessment deferred to PCP.   Hearing/Vision screen Hearing Screening Comments: Able to hear conversational tones w/o difficulty. No issues reported.  Passed whisper test Vision Screening Comments: appointment yearly   Dietary issues and exercise activities discussed: Current Exercise Habits: Home exercise routine, Type of exercise: walking, Time (Minutes): 45, Frequency (Times/Week): 6, Weekly Exercise (Minutes/Week): 270, Intensity: Mild, Exercise limited by: orthopedic condition(s)  Diet (meal preparation, eat out, water intake, caffeinated beverages, dairy products, fruits and vegetables): in general, a "healthy" diet  , well balanced, eats a variety of fruits and vegetables daily, limits salt, fat/cholesterol, sugar, caffeine, drinks 3-4 glasses of water daily.  Encouraged patient to increase daily water intake.  Goals    . Stay as healthy as I can          Continue to walk, eat healthy, stay informed, enjoy life and family,       Depression Screen PHQ 2/9 Scores 07/16/2017 03/29/2017 10/09/2015  PHQ - 2 Score 0 0 0  PHQ- 9 Score 0 - -    Fall Risk Fall Risk  07/16/2017 03/29/2017  10/09/2015  Falls in the past year? No No No    Cognitive Function: MMSE - Mini Mental State Exam 07/16/2017  Orientation to time 5  Orientation to Place 5  Registration 3  Attention/ Calculation 5  Recall 2  Language- name 2 objects 2  Language- repeat 1  Language- follow 3 step command 3  Language- read & follow direction 1  Write a sentence 1  Copy design 1  Total score 29        Screening Tests Health Maintenance  Topic Date Due  . DEXA SCAN  06/28/2002  . INFLUENZA VACCINE  05/05/2017  . TETANUS/TDAP  01/29/2026  . PNA vac Low Risk Adult  Completed      Plan:    Continue doing brain stimulating activities (puzzles, reading, adult coloring books, staying active) to keep memory sharp.   Continue to eat heart healthy diet (full  of fruits, vegetables, whole grains, lean protein, water--limit salt, fat, and sugar intake) and increase physical activity as tolerated.  I have personally reviewed and noted the following in the patient's chart:   . Medical and social history . Use of alcohol, tobacco or illicit drugs  . Current medications and supplements . Functional ability and status . Nutritional status . Physical activity . Advanced directives . List of other physicians . Vitals . Screenings to include cognitive, depression, and falls . Referrals and appointments  In addition, I have reviewed and discussed with patient certain preventive protocols, quality metrics, and best practice recommendations. A written personalized care plan for preventive services as well as general preventive health recommendations were provided to patient.     Michiel Cowboy, RN   07/16/2017

## 2017-07-16 NOTE — Progress Notes (Signed)
Medical screening examination/treatment/procedure(s) were performed by non-physician practitioner and as supervising physician I was immediately available for consultation/collaboration. I agree with above. Audie Wieser A Jenefer Woerner, MD 

## 2017-07-21 ENCOUNTER — Other Ambulatory Visit: Payer: Self-pay | Admitting: Internal Medicine

## 2017-07-23 ENCOUNTER — Other Ambulatory Visit: Payer: Self-pay

## 2017-07-23 MED ORDER — RAMIPRIL 10 MG PO CAPS: 10.0000 mg | ORAL_CAPSULE | Freq: Two times a day (BID) | ORAL | 3 refills | Status: DC

## 2017-07-28 ENCOUNTER — Ambulatory Visit (INDEPENDENT_AMBULATORY_CARE_PROVIDER_SITE_OTHER)
Admission: RE | Admit: 2017-07-28 | Discharge: 2017-07-28 | Disposition: A | Discharge status: Home / Self Care | Payer: Medicare Other | Source: Ambulatory Visit | Attending: Internal Medicine | Admitting: Internal Medicine

## 2017-07-28 DIAGNOSIS — E2839 Other primary ovarian failure: Secondary | ICD-10-CM | POA: Diagnosis not present

## 2017-08-11 ENCOUNTER — Other Ambulatory Visit: Payer: Self-pay | Admitting: Internal Medicine

## 2017-09-09 ENCOUNTER — Other Ambulatory Visit: Payer: Self-pay | Admitting: *Deleted

## 2017-09-09 MED ORDER — RALOXIFENE HCL 60 MG PO TABS: 60.0000 mg | ORAL_TABLET | Freq: Every day | ORAL | 0 refills | Status: DC

## 2017-10-07 ENCOUNTER — Encounter: Payer: Self-pay | Admitting: Internal Medicine

## 2017-10-07 ENCOUNTER — Telehealth: Payer: Self-pay | Admitting: Internal Medicine

## 2017-10-07 ENCOUNTER — Ambulatory Visit: Payer: Medicare Other | Admitting: Internal Medicine

## 2017-10-07 VITALS — BP 138/82 | HR 76 | Temp 98.7°F | Ht 63.0 in | Wt 177.0 lb

## 2017-10-07 DIAGNOSIS — K219 Gastro-esophageal reflux disease without esophagitis: Secondary | ICD-10-CM

## 2017-10-07 DIAGNOSIS — Z0000 Encounter for general adult medical examination without abnormal findings: Secondary | ICD-10-CM

## 2017-10-07 DIAGNOSIS — I10 Essential (primary) hypertension: Secondary | ICD-10-CM | POA: Diagnosis not present

## 2017-10-07 DIAGNOSIS — E039 Hypothyroidism, unspecified: Secondary | ICD-10-CM

## 2017-10-07 NOTE — Telephone Encounter (Signed)
changed

## 2017-10-07 NOTE — Assessment & Plan Note (Signed)
Taking prilosec daily with good control of symptoms.  

## 2017-10-07 NOTE — Assessment & Plan Note (Signed)
BP at goal on amlodipine and metoprolol and ramipril. Recent BMP at goal without change needed.

## 2017-10-07 NOTE — Patient Instructions (Signed)
Keep up the good work with your health and consider starting the exercise again. Even walking around the house.   Health Maintenance, Female Adopting a healthy lifestyle and getting preventive care can go a long way to promote health and wellness. Talk with your health care provider about what schedule of regular examinations is right for you. This is a good chance for you to check in with your provider about disease prevention and staying healthy. In between checkups, there are plenty of things you can do on your own. Experts have done a lot of research about which lifestyle changes and preventive measures are most likely to keep you healthy. Ask your health care provider for more information. Weight and diet Eat a healthy diet  Be sure to include plenty of vegetables, fruits, low-fat dairy products, and lean protein.  Do not eat a lot of foods high in solid fats, added sugars, or salt.  Get regular exercise. This is one of the most important things you can do for your health. ? Most adults should exercise for at least 150 minutes each week. The exercise should increase your heart rate and make you sweat (moderate-intensity exercise). ? Most adults should also do strengthening exercises at least twice a week. This is in addition to the moderate-intensity exercise.  Maintain a healthy weight  Body mass index (BMI) is a measurement that can be used to identify possible weight problems. It estimates body fat based on height and weight. Your health care provider can help determine your BMI and help you achieve or maintain a healthy weight.  For females 70 years of age and older: ? A BMI below 18.5 is considered underweight. ? A BMI of 18.5 to 24.9 is normal. ? A BMI of 25 to 29.9 is considered overweight. ? A BMI of 30 and above is considered obese.  Watch levels of cholesterol and blood lipids  You should start having your blood tested for lipids and cholesterol at 81 years of age, then  have this test every 5 years.  You may need to have your cholesterol levels checked more often if: ? Your lipid or cholesterol levels are high. ? You are older than 81 years of age. ? You are at high risk for heart disease.  Cancer screening Lung Cancer  Lung cancer screening is recommended for adults 46-70 years old who are at high risk for lung cancer because of a history of smoking.  A yearly low-dose CT scan of the lungs is recommended for people who: ? Currently smoke. ? Have quit within the past 15 years. ? Have at least a 30-pack-year history of smoking. A pack year is smoking an average of one pack of cigarettes a day for 1 year.  Yearly screening should continue until it has been 15 years since you quit.  Yearly screening should stop if you develop a health problem that would prevent you from having lung cancer treatment.  Breast Cancer  Practice breast self-awareness. This means understanding how your breasts normally appear and feel.  It also means doing regular breast self-exams. Let your health care provider know about any changes, no matter how small.  If you are in your 20s or 30s, you should have a clinical breast exam (CBE) by a health care provider every 1-3 years as part of a regular health exam.  If you are 8 or older, have a CBE every year. Also consider having a breast X-ray (mammogram) every year.  If you have a  family history of breast cancer, talk to your health care provider about genetic screening.  If you are at high risk for breast cancer, talk to your health care provider about having an MRI and a mammogram every year.  Breast cancer gene (BRCA) assessment is recommended for women who have family members with BRCA-related cancers. BRCA-related cancers include: ? Breast. ? Ovarian. ? Tubal. ? Peritoneal cancers.  Results of the assessment will determine the need for genetic counseling and BRCA1 and BRCA2 testing.  Cervical Cancer Your health  care provider may recommend that you be screened regularly for cancer of the pelvic organs (ovaries, uterus, and vagina). This screening involves a pelvic examination, including checking for microscopic changes to the surface of your cervix (Pap test). You may be encouraged to have this screening done every 3 years, beginning at age 62.  For women ages 58-65, health care providers may recommend pelvic exams and Pap testing every 3 years, or they may recommend the Pap and pelvic exam, combined with testing for human papilloma virus (HPV), every 5 years. Some types of HPV increase your risk of cervical cancer. Testing for HPV may also be done on women of any age with unclear Pap test results.  Other health care providers may not recommend any screening for nonpregnant women who are considered low risk for pelvic cancer and who do not have symptoms. Ask your health care provider if a screening pelvic exam is right for you.  If you have had past treatment for cervical cancer or a condition that could lead to cancer, you need Pap tests and screening for cancer for at least 20 years after your treatment. If Pap tests have been discontinued, your risk factors (such as having a new sexual partner) need to be reassessed to determine if screening should resume. Some women have medical problems that increase the chance of getting cervical cancer. In these cases, your health care provider may recommend more frequent screening and Pap tests.  Colorectal Cancer  This type of cancer can be detected and often prevented.  Routine colorectal cancer screening usually begins at 81 years of age and continues through 81 years of age.  Your health care provider may recommend screening at an earlier age if you have risk factors for colon cancer.  Your health care provider may also recommend using home test kits to check for hidden blood in the stool.  A small camera at the end of a tube can be used to examine your colon  directly (sigmoidoscopy or colonoscopy). This is done to check for the earliest forms of colorectal cancer.  Routine screening usually begins at age 76.  Direct examination of the colon should be repeated every 5-10 years through 81 years of age. However, you may need to be screened more often if early forms of precancerous polyps or small growths are found.  Skin Cancer  Check your skin from head to toe regularly.  Tell your health care provider about any new moles or changes in moles, especially if there is a change in a mole's shape or color.  Also tell your health care provider if you have a mole that is larger than the size of a pencil eraser.  Always use sunscreen. Apply sunscreen liberally and repeatedly throughout the day.  Protect yourself by wearing long sleeves, pants, a wide-brimmed hat, and sunglasses whenever you are outside.  Heart disease, diabetes, and high blood pressure  High blood pressure causes heart disease and increases the risk of  stroke. High blood pressure is more likely to develop in: ? People who have blood pressure in the high end of the normal range (130-139/85-89 mm Hg). ? People who are overweight or obese. ? People who are African American.  If you are 34-76 years of age, have your blood pressure checked every 3-5 years. If you are 28 years of age or older, have your blood pressure checked every year. You should have your blood pressure measured twice-once when you are at a hospital or clinic, and once when you are not at a hospital or clinic. Record the average of the two measurements. To check your blood pressure when you are not at a hospital or clinic, you can use: ? An automated blood pressure machine at a pharmacy. ? A home blood pressure monitor.  If you are between 33 years and 77 years old, ask your health care provider if you should take aspirin to prevent strokes.  Have regular diabetes screenings. This involves taking a blood sample to  check your fasting blood sugar level. ? If you are at a normal weight and have a low risk for diabetes, have this test once every three years after 81 years of age. ? If you are overweight and have a high risk for diabetes, consider being tested at a younger age or more often. Preventing infection Hepatitis B  If you have a higher risk for hepatitis B, you should be screened for this virus. You are considered at high risk for hepatitis B if: ? You were born in a country where hepatitis B is common. Ask your health care provider which countries are considered high risk. ? Your parents were born in a high-risk country, and you have not been immunized against hepatitis B (hepatitis B vaccine). ? You have HIV or AIDS. ? You use needles to inject street drugs. ? You live with someone who has hepatitis B. ? You have had sex with someone who has hepatitis B. ? You get hemodialysis treatment. ? You take certain medicines for conditions, including cancer, organ transplantation, and autoimmune conditions.  Hepatitis C  Blood testing is recommended for: ? Everyone born from [birth year] through 1963. ? Anyone with known risk factors for hepatitis C.  Sexually transmitted infections (STIs)  You should be screened for sexually transmitted infections (STIs) including gonorrhea and chlamydia if: ? You are sexually active and are younger than 81 years of age. ? You are older than 81 years of age and your health care provider tells you that you are at risk for this type of infection. ? Your sexual activity has changed since you were last screened and you are at an increased risk for chlamydia or gonorrhea. Ask your health care provider if you are at risk.  If you do not have HIV, but are at risk, it may be recommended that you take a prescription medicine daily to prevent HIV infection. This is called pre-exposure prophylaxis (PrEP). You are considered at risk if: ? You are sexually active and do not regularly  use condoms or know the HIV status of your partner(s). ? You take drugs by injection. ? You are sexually active with a partner who has HIV.  Talk with your health care provider about whether you are at high risk of being infected with HIV. If you choose to begin PrEP, you should first be tested for HIV. You should then be tested every 3 months for as long as you are taking PrEP. Pregnancy  If  you are premenopausal and you may become pregnant, ask your health care provider about preconception counseling.  If you may become pregnant, take 400 to 800 micrograms (mcg) of folic acid every day.  If you want to prevent pregnancy, talk to your health care provider about birth control (contraception). Osteoporosis and menopause  Osteoporosis is a disease in which the bones lose minerals and strength with aging. This can result in serious bone fractures. Your risk for osteoporosis can be identified using a bone density scan.  If you are 83 years of age or older, or if you are at risk for osteoporosis and fractures, ask your health care provider if you should be screened.  Ask your health care provider whether you should take a calcium or vitamin D supplement to lower your risk for osteoporosis.  Menopause may have certain physical symptoms and risks.  Hormone replacement therapy may reduce some of these symptoms and risks. Talk to your health care provider about whether hormone replacement therapy is right for you. Follow these instructions at home:  Schedule regular health, dental, and eye exams.  Stay current with your immunizations.  Do not use any tobacco products including cigarettes, chewing tobacco, or electronic cigarettes.  If you are pregnant, do not drink alcohol.  If you are breastfeeding, limit how much and how often you drink alcohol.  Limit alcohol intake to no more than 1 drink per day for nonpregnant women. One drink equals 12 ounces of beer, 5 ounces of wine, or 1 ounces  of hard liquor.  Do not use street drugs.  Do not share needles.  Ask your health care provider for help if you need support or information about quitting drugs.  Tell your health care provider if you often feel depressed.  Tell your health care provider if you have ever been abused or do not feel safe at home. This information is not intended to replace advice given to you by your health care provider. Make sure you discuss any questions you have with your health care provider. Document Released: 04/06/2011 Document Revised: 02/27/2016 Document Reviewed: 06/25/2015 Elsevier Interactive Patient Education  [employer].

## 2017-10-07 NOTE — Telephone Encounter (Signed)
Pt noticed on her AVS that her weight was 187 and she states it was 177, please advise on the change

## 2017-10-07 NOTE — Progress Notes (Signed)
   Subjective:    Patient ID: Ashlee Weeks, female    DOB: 1934-12-11, 83 y.o.   MRN: [id number]  HPI The patient is an 83 YO female coming in for physical. No new concerns.   PMH, [facility], social history reviewed and updated.   Review of Systems  Constitutional: Negative.   HENT: Negative.   Eyes: Negative.   Respiratory: Negative for cough, chest tightness and shortness of breath.   Cardiovascular: Negative for chest pain, palpitations and leg swelling.  Gastrointestinal: Negative for abdominal distention, abdominal pain, constipation, diarrhea, nausea and vomiting.  Musculoskeletal: Positive for arthralgias and back pain. Negative for gait problem, joint swelling and myalgias.  Skin: Negative.   Neurological: Negative.   Psychiatric/Behavioral: Negative.      Objective:   Physical Exam  Constitutional: She is oriented to person, place, and time. She appears well-developed and well-nourished.  HENT:  Head: Normocephalic and atraumatic.  Eyes: EOM are normal.  Neck: Normal range of motion.  Cardiovascular: Normal rate and regular rhythm.  Pulmonary/Chest: Effort normal and breath sounds normal. No respiratory distress. She has no wheezes. She has no rales.  Abdominal: Soft. Bowel sounds are normal. She exhibits no distension. There is no tenderness. There is no rebound.  Musculoskeletal: She exhibits no edema.  Neurological: She is alert and oriented to person, place, and time. Coordination normal.  Skin: Skin is warm and dry.  Psychiatric: She has a normal mood and affect.   Vitals:   10/07/17 1301  BP: 138/82  Pulse: 76  Temp: 98.7 F (37.1 C)  TempSrc: Oral  SpO2: 97%  Weight: 187 lb (84.8 kg)  Height: 5\' 3"  (1.6 m)      Assessment & Plan:

## 2017-10-07 NOTE — Assessment & Plan Note (Signed)
Recent thyroid function normal on her 25 mcg dose synthroid daily. No adjustment today.

## 2017-10-07 NOTE — Assessment & Plan Note (Signed)
Colonoscopy aged out, bone density reviewed with her at visit. Tetanus and flu and pneumonia up to date. Counseled about shingrix. Counseled about sun safety and mole surveillance. Given 10 year screening recommendations.

## 2017-11-04 ENCOUNTER — Other Ambulatory Visit: Payer: Self-pay | Admitting: Internal Medicine

## 2017-11-15 ENCOUNTER — Other Ambulatory Visit: Payer: Self-pay | Admitting: Internal Medicine

## 2017-11-15 DIAGNOSIS — I10 Essential (primary) hypertension: Secondary | ICD-10-CM

## 2017-11-16 ENCOUNTER — Other Ambulatory Visit: Payer: Self-pay

## 2017-11-16 MED ORDER — RAMIPRIL 10 MG PO CAPS: 10.0000 mg | ORAL_CAPSULE | Freq: Two times a day (BID) | ORAL | 3 refills | Status: DC

## 2018-01-10 ENCOUNTER — Other Ambulatory Visit: Payer: Self-pay | Admitting: Internal Medicine

## 2018-02-08 ENCOUNTER — Other Ambulatory Visit: Payer: Self-pay | Admitting: Internal Medicine

## 2018-02-22 ENCOUNTER — Other Ambulatory Visit: Payer: Self-pay | Admitting: Internal Medicine

## 2018-04-06 ENCOUNTER — Ambulatory Visit: Payer: Medicare Other | Admitting: Internal Medicine

## 2018-05-06 ENCOUNTER — Other Ambulatory Visit: Payer: Self-pay | Admitting: Internal Medicine

## 2018-05-06 DIAGNOSIS — I10 Essential (primary) hypertension: Secondary | ICD-10-CM

## 2018-05-17 ENCOUNTER — Other Ambulatory Visit: Payer: Self-pay | Admitting: Nurse Practitioner

## 2018-05-17 DIAGNOSIS — E039 Hypothyroidism, unspecified: Secondary | ICD-10-CM

## 2018-05-19 ENCOUNTER — Encounter: Payer: Self-pay | Admitting: Internal Medicine

## 2018-05-19 ENCOUNTER — Other Ambulatory Visit (INDEPENDENT_AMBULATORY_CARE_PROVIDER_SITE_OTHER): Payer: Medicare Other

## 2018-05-19 ENCOUNTER — Ambulatory Visit: Payer: Medicare Other | Admitting: Internal Medicine

## 2018-05-19 VITALS — BP 130/78 | HR 81 | Temp 97.5°F | Ht 63.0 in | Wt 180.0 lb

## 2018-05-19 DIAGNOSIS — R32 Unspecified urinary incontinence: Secondary | ICD-10-CM | POA: Insufficient documentation

## 2018-05-19 DIAGNOSIS — I10 Essential (primary) hypertension: Secondary | ICD-10-CM | POA: Diagnosis not present

## 2018-05-19 DIAGNOSIS — R159 Full incontinence of feces: Secondary | ICD-10-CM

## 2018-05-19 DIAGNOSIS — N393 Stress incontinence (female) (male): Secondary | ICD-10-CM | POA: Diagnosis not present

## 2018-05-19 DIAGNOSIS — R152 Fecal urgency: Secondary | ICD-10-CM

## 2018-05-19 DIAGNOSIS — E039 Hypothyroidism, unspecified: Secondary | ICD-10-CM

## 2018-05-19 DIAGNOSIS — K219 Gastro-esophageal reflux disease without esophagitis: Secondary | ICD-10-CM

## 2018-05-19 LAB — COMPREHENSIVE METABOLIC PANEL
ALBUMIN: 4.2 g/dL (ref 3.5–5.2)
ALK PHOS: 57 U/L (ref 39–117)
ALT: 31 U/L (ref 0–35)
AST: 44 U/L — ABNORMAL HIGH (ref 0–37)
BILIRUBIN TOTAL: 0.4 mg/dL (ref 0.2–1.2)
BUN: 16 mg/dL (ref 6–23)
CO2: 28 mEq/L (ref 19–32)
Calcium: 9.6 mg/dL (ref 8.4–10.5)
Chloride: 106 mEq/L (ref 96–112)
Creatinine, Ser: 0.72 mg/dL (ref 0.40–1.20)
GFR: 82.65 mL/min (ref >60.00)
Glucose, Bld: 133 mg/dL — ABNORMAL HIGH (ref 70–99)
POTASSIUM: 3.9 meq/L (ref 3.5–5.1)
Sodium: 142 mEq/L (ref 135–145)
TOTAL PROTEIN: 6.7 g/dL (ref 6.0–8.3)

## 2018-05-19 LAB — CBC
HEMATOCRIT: 40 % (ref 36.0–46.0)
HEMOGLOBIN: 13.3 g/dL (ref 12.0–15.0)
MCHC: 33.3 g/dL (ref 30.0–36.0)
MCV: 88.7 fl (ref 78.0–100.0)
Platelets: 173 10*3/uL (ref 150.0–400.0)
RBC: 4.52 Mil/uL (ref 3.87–5.11)
RDW: 13.6 % (ref 11.5–15.5)
WBC: 6.6 10*3/uL (ref 4.0–10.5)

## 2018-05-19 LAB — TSH: TSH: 3.06 u[IU]/mL (ref 0.35–4.50)

## 2018-05-19 LAB — LIPID PANEL
CHOLESTEROL: 166 mg/dL (ref 0–200)
HDL: 46.1 mg/dL (ref >39.00)
NonHDL: 119.41
Total CHOL/HDL Ratio: 4
Triglycerides: 237 mg/dL — ABNORMAL HIGH (ref 0.0–149.0)
VLDL: 47.4 mg/dL — AB (ref 0.0–40.0)

## 2018-05-19 LAB — T4, FREE: FREE T4: 0.86 ng/dL (ref 0.60–1.60)

## 2018-05-19 LAB — LDL CHOLESTEROL, DIRECT: Direct LDL: 101 mg/dL

## 2018-05-19 MED ORDER — CHOLESTYRAMINE LIGHT 4 G PO PACK: 4.0000 g | PACK | Freq: Two times a day (BID) | ORAL | 6 refills | Status: DC

## 2018-05-19 MED ORDER — LEVOTHYROXINE SODIUM 25 MCG PO TABS: 25.0000 ug | ORAL_TABLET | Freq: Every day | ORAL | 3 refills | Status: AC

## 2018-05-19 MED ORDER — MIRABEGRON ER 50 MG PO TB24: 50.0000 mg | ORAL_TABLET | Freq: Every day | ORAL | 3 refills | Status: DC

## 2018-05-19 MED ORDER — OMEPRAZOLE 20 MG PO CPDR: 20.0000 mg | DELAYED_RELEASE_CAPSULE | Freq: Every day | ORAL | 3 refills | Status: AC

## 2018-05-19 MED ORDER — DILTIAZEM HCL ER COATED BEADS 180 MG PO CP24: 180.0000 mg | ORAL_CAPSULE | Freq: Every day | ORAL | 6 refills | Status: DC

## 2018-05-19 NOTE — Assessment & Plan Note (Signed)
Rx for myrbetriq to try for this and we talked about 1 month trial.

## 2018-05-19 NOTE — Assessment & Plan Note (Signed)
Checking T4 and TSH. Could be off due to off meds for 1 week. Taking synthroid 25 mcg daily.

## 2018-05-19 NOTE — Progress Notes (Signed)
   Subjective:    Patient ID: Ashlee Weeks, female    DOB: 28-Mar-1935, 83 y.o.   MRN: [id number]  HPI The patient is an 83 YO female coming in for follow up of her thyroid (taking synthroid, out for 1 week due to no refills at the pharmacy, otherwise takes daily in the morning) bladder frequency and incontinence (going on years, has tried an old medicine for it, gave her bad dry mouth, wears pads, denies burning or signs of infection, no abdominal pain), and increasing urgency with bowels and rare diarrhea (since gallbladder surgery, more often lately, about every 1-2 weeks she has a diarrhea and urgency and cannot make it with accident, this is very upsetting to her, more often with eating out, denies blood in stool, denies watery stools, is changing habits with trips and going out etc).   Review of Systems  Constitutional: Negative.   HENT: Negative.   Eyes: Negative.   Respiratory: Negative for cough, chest tightness and shortness of breath.   Cardiovascular: Negative for chest pain, palpitations and leg swelling.  Gastrointestinal: Positive for diarrhea. Negative for abdominal distention, abdominal pain, constipation, nausea and vomiting.       Urgency  Genitourinary: Positive for frequency and urgency. Negative for difficulty urinating and dysuria.       Incontinence  Musculoskeletal: Negative.   Skin: Negative.   Neurological: Negative.   Psychiatric/Behavioral: Negative.       Objective:   Physical Exam  Constitutional: She is oriented to person, place, and time. She appears well-developed and well-nourished.  HENT:  Head: Normocephalic and atraumatic.  Eyes: EOM are normal.  Neck: Normal range of motion.  Cardiovascular: Normal rate and regular rhythm.  Pulmonary/Chest: Effort normal and breath sounds normal. No respiratory distress. She has no wheezes. She has no rales.  Abdominal: Soft. Bowel sounds are normal. She exhibits no distension. There is no tenderness. There is no  rebound.  Musculoskeletal: She exhibits no edema.  Neurological: She is alert and oriented to person, place, and time. Coordination normal.  Skin: Skin is warm and dry.  Psychiatric: She has a normal mood and affect.   Vitals:   05/19/18 1418  BP: 130/78  Pulse: 81  Temp: (!) 97.5 F (36.4 C)  TempSrc: Oral  SpO2: 95%  Weight: 180 lb (81.6 kg)  Height: 5\' 3"  (1.6 m)      Assessment & Plan:

## 2018-05-19 NOTE — Assessment & Plan Note (Signed)
Suspect related to fatty foods since worst with eating out. Rx for cholestyramine for when she will eat out or travel up to BID. We talked about cutting back or down if constipation.

## 2018-05-19 NOTE — Patient Instructions (Addendum)
We will check the labs today.   We have sent in the refills.   We have sent in the medicine for the bowels to try a packet up to twice a day for the diarrhea. If you have constipation cut back on the amount or frequency of this.   We have sent in for the bladder myrbetriq to take 1 pill daily. It may take up to a month before you see full results.   We have sent in diltiazem to change with amlodipine to see if this stops the swelling.

## 2018-05-20 ENCOUNTER — Other Ambulatory Visit: Payer: Self-pay | Admitting: Internal Medicine

## 2018-07-19 NOTE — Progress Notes (Addendum)
Subjective:   Ashlee Weeks is a 83 y.o. female who presents for Medicare Annual (Subsequent) preventive examination.  Review of Systems:  No ROS.  Medicare Wellness Visit. Additional risk factors are reflected in the social history.  Cardiac Risk Factors include: advanced age (>61men, >95 women);hypertension;obesity (BMI >30kg/m2) Sleep patterns: feels rested on waking, gets up 1 times nightly to void and sleeps 7 hours nightly.    Home Safety/Smoke Alarms: Feels safe in home. Smoke alarms in place.  Living environment; residence and Firearm Safety: 1-story house/ trailer, no firearms. Lives alone, no needs for DME, good support system Seat Belt Safety/Bike Helmet: Wears seat belt.     Objective:     Vitals: BP 138/78   Pulse 70   Resp 18   Ht 5\' 3"  (1.6 m)   Wt 180 lb (81.6 kg)   SpO2 98%   BMI 31.89 kg/m   Body mass index is 31.89 kg/m.  Advanced Directives 07/20/2018 07/16/2017  Does Patient Have a Medical Advance Directive? Yes Yes  Type of Paramedic of [place];Living will [place];Living will  Copy of [place] in Chart? No - copy requested No - copy requested    Tobacco Social History   Tobacco Use  Smoking Status Never Smoker  Smokeless Tobacco Never Used     Counseling given: Not Answered  Past Medical History:  Diagnosis Date  . Arthritis   . Blood in stool   . Diverticulitis   . GERD (gastroesophageal reflux disease)   . Hypertension   . Thyroid disease   . Urine incontinence    Past Surgical History:  Procedure Laterality Date  . APPENDECTOMY    . CHOLECYSTECTOMY    . EYE SURGERY Right 01/07/2016   artificial lens implant  . EYE SURGERY Left 01/23/2016   artificial lens implant  . REPLACEMENT TOTAL KNEE Bilateral   . THYROID SURGERY     1/2 of thyroid removed  . TONSILLECTOMY AND ADENOIDECTOMY    . TOTAL SHOULDER REPLACEMENT Left 2016  . TUBAL LIGATION     Family  History  Problem Relation Age of Onset  . Hypertension Mother   . Heart disease Father   . Hypertension Maternal Aunt   . Miscarriages / Stillbirths Maternal Aunt   . Arthritis Maternal Grandmother   . Alcohol abuse Paternal Grandmother    Social History   Socioeconomic History  . Marital status: Widowed    Spouse name: Not on file  . Number of children: 1  . Years of education: Not on file  . Highest education level: Not on file  Occupational History  . Not on file  Social Needs  . Financial resource strain: Not hard at all  . Food insecurity:    Worry: Never true    Inability: Never true  . Transportation needs:    Medical: No    Non-medical: No  Tobacco Use  . Smoking status: Never Smoker  . Smokeless tobacco: Never Used  Substance and Sexual Activity  . Alcohol use: No    Alcohol/week: 0.0 standard drinks  . Drug use: No  . Sexual activity: Not Currently  Lifestyle  . Physical activity:    Days per week: 3 days    Minutes per session: 30 min  . Stress: Not at all  Relationships  . Social connections:    Talks on phone: More than three times a week    Gets together: More than three times a  week    Attends religious service: More than 4 times per year    Active member of club or organization: Yes    Attends meetings of clubs or organizations: More than 4 times per year    Relationship status: Widowed  Other Topics Concern  . Not on file  Social History Narrative  . Not on file    Outpatient Encounter Medications as of 07/20/2018  Medication Sig  . Calcium Carbonate-Vitamin D (CALTRATE 600+D PO) Take by mouth.   . Cholecalciferol (VITAMIN D-3) 1000 units CAPS Take by mouth. Take one by mouth twice daily  . cholestyramine light (PREVALITE) 4 g packet Take 1 packet (4 g total) by mouth 2 (two) times daily. (Patient taking differently: Take 4 g by mouth as needed. )  . clindamycin (CLEOCIN T) 1 % lotion Apply topically daily.   . cyanocobalamin 1000 MCG  tablet Take 100 mcg by mouth daily.  Marland Kitchen diltiazem (CARDIZEM CD) 180 MG 24 hr capsule Take 1 capsule (180 mg total) by mouth daily.  Marland Kitchen ibuprofen (ADVIL,MOTRIN) 200 MG tablet Take 200 mg by mouth every 6 (six) hours as needed.  Marland Kitchen levothyroxine (SYNTHROID, LEVOTHROID) 25 MCG tablet Take 1 tablet (25 mcg total) by mouth daily before breakfast.  . metoprolol tartrate (LOPRESSOR) 50 MG tablet TAKE 1 TABLET BY MOUTH TWICE A DAY  . mirabegron ER (MYRBETRIQ) 50 MG TB24 tablet Take 1 tablet (50 mg total) by mouth daily.  . Multiple Vitamins-Minerals (MULTIVITAMIN WITH MINERALS) tablet Take 1 tablet by mouth daily.  Marland Kitchen omeprazole (PRILOSEC) 20 MG capsule Take 1 capsule (20 mg total) by mouth daily.  . raloxifene (EVISTA) 60 MG tablet Take 1 tablet (60 mg total) by mouth daily.  . ramipril (ALTACE) 10 MG capsule TAKE 1 CAPSULE BY MOUTH 2 TIMES DAILY.  . [DISCONTINUED] Misc Natural Products (TART CHERRY ADVANCED PO) Take 800 mg by mouth 2 (two) times daily.   No facility-administered encounter medications on file as of 07/20/2018.     Activities of Daily Living In your present state of health, do you have any difficulty performing the following activities: 07/20/2018  Hearing? N  Vision? N  Difficulty concentrating or making decisions? N  Walking or climbing stairs? N  Dressing or bathing? N  Doing errands, shopping? N  Preparing Food and eating ? N  Using the Toilet? N  In the past six months, have you accidently leaked urine? N  Do you have problems with loss of bowel control? N  Managing your Medications? N  Managing your Finances? N  Housekeeping or managing your Housekeeping? N  Some recent data might be hidden    Patient Care Team: Hoyt Koch, MD as PCP - General (Internal Medicine)    Assessment:   This is a routine wellness examination for Ashlee Weeks. Physical assessment deferred to PCP.   Exercise Activities and Dietary recommendations Current Exercise Habits: Home exercise  routine, Type of exercise: walking, Time (Minutes): 30, Frequency (Times/Week): 3, Weekly Exercise (Minutes/Week): 90, Intensity: Mild, Exercise limited by: orthopedic condition(s)  Diet (meal preparation, eat out, water intake, caffeinated beverages, dairy products, fruits and vegetables): in general, a "healthy" diet  , well balanced   Reviewed heart healthy diet. Encouraged patient to increase daily water and healthy fluid intake.     Goals    . Patient Stated     I want to increase physical activity by walking more and watching what I eat.     . Stay as healthy as  I can     Continue to walk, eat healthy, stay informed, enjoy life and family,        Fall Risk Fall Risk  07/20/2018 07/16/2017 03/29/2017 10/09/2015  Falls in the past year? No No No No    Depression Screen PHQ 2/9 Scores 07/20/2018 07/16/2017 03/29/2017 10/09/2015  PHQ - 2 Score 0 0 0 0  PHQ- 9 Score - 0 - -     Cognitive Function MMSE - Mini Mental State Exam 07/20/2018 07/16/2017  Orientation to time 5 5  Orientation to Place 5 5  Registration 3 3  Attention/ Calculation 5 5  Recall 2 2  Language- name 2 objects 2 2  Language- repeat 1 1  Language- follow 3 step command 3 3  Language- read & follow direction 1 1  Write a sentence 1 1  Copy design 1 1  Total score 29 29        Immunization History  Administered Date(s) Administered  . Influenza, High Dose Seasonal PF 07/30/2016, 07/16/2017, 07/20/2018  . Influenza,inj,Quad PF,6+ Mos 10/09/2015  . Pneumococcal Conjugate-13 10/09/2015  . Pneumococcal Polysaccharide-23 10/05/2004  . Tdap 01/30/2016   Screening Tests Health Maintenance  Topic Date Due  . INFLUENZA VACCINE  05/05/2018  . TETANUS/TDAP  01/29/2026  . DEXA SCAN  Completed  . PNA vac Low Risk Adult  Completed      Plan:     Continue doing brain stimulating activities (puzzles, reading, adult coloring books, staying active) to keep memory sharp.   Continue to eat heart healthy  diet (full of fruits, vegetables, whole grains, lean protein, water--limit salt, fat, and sugar intake) and increase physical activity as tolerated.  I have personally reviewed and noted the following in the patient's chart:   . Medical and social history . Use of alcohol, tobacco or illicit drugs  . Current medications and supplements . Functional ability and status . Nutritional status . Physical activity . Advanced directives . List of other physicians . Vitals . Screenings to include cognitive, depression, and falls . Referrals and appointments  In addition, I have reviewed and discussed with patient certain preventive protocols, quality metrics, and best practice recommendations. A written personalized care plan for preventive services as well as general preventive health recommendations were provided to patient.     Michiel Cowboy, RN  07/20/2018   Medical screening examination/treatment/procedure(s) were performed by non-physician practitioner and as supervising physician I was immediately available for consultation/collaboration. I agree with above. Scarlette Calico, MD

## 2018-07-20 ENCOUNTER — Ambulatory Visit (INDEPENDENT_AMBULATORY_CARE_PROVIDER_SITE_OTHER): Payer: Medicare Other | Admitting: *Deleted

## 2018-07-20 VITALS — BP 138/78 | HR 70 | Resp 18 | Ht 63.0 in | Wt 180.0 lb

## 2018-07-20 DIAGNOSIS — Z0000 Encounter for general adult medical examination without abnormal findings: Secondary | ICD-10-CM | POA: Diagnosis not present

## 2018-07-20 DIAGNOSIS — Z23 Encounter for immunization: Secondary | ICD-10-CM

## 2018-07-20 NOTE — Patient Instructions (Addendum)
Continue doing brain stimulating activities (puzzles, reading, adult coloring books, staying active) to keep memory sharp.   Continue to eat heart healthy diet (full of fruits, vegetables, whole grains, lean protein, water--limit salt, fat, and sugar intake) and increase physical activity as tolerated.   Ashlee Weeks , Thank you for taking time to come for your Medicare Wellness Visit. I appreciate your ongoing commitment to your health goals. Please review the following plan we discussed and let me know if I can assist you in the future.   These are the goals we discussed: Goals    . Patient Stated     I want to increase physical activity by walking more and watching what I eat.     . Stay as healthy as I can     Continue to walk, eat healthy, stay informed, enjoy life and family,        This is a list of the screening recommended for you and due dates:  Health Maintenance  Topic Date Due  . Flu Shot  05/05/2018  . Tetanus Vaccine  01/29/2026  . DEXA scan (bone density measurement)  Completed  . Pneumonia vaccines  Completed   Influenza Virus Vaccine injection What is this medicine? INFLUENZA VIRUS VACCINE (in floo EN zuh VAHY ruhs vak SEEN) helps to reduce the risk of getting influenza also known as the flu. The vaccine only helps protect you against some strains of the flu. This medicine may be used for other purposes; ask your health care provider or pharmacist if you have questions. COMMON BRAND NAME(S): Afluria, Agriflu, Alfuria, FLUAD, Fluarix, Fluarix Quadrivalent, Flublok, Flublok Quadrivalent, FLUCELVAX, Flulaval, Fluvirin, Fluzone, Fluzone High-Dose, Fluzone Intradermal What should I tell my health care provider before I take this medicine? They need to know if you have any of these conditions: -bleeding disorder like hemophilia -fever or infection -Guillain-Barre syndrome or other neurological problems -immune system problems -infection with the human immunodeficiency  virus (HIV) or AIDS -low blood platelet counts -multiple sclerosis -an unusual or allergic reaction to influenza virus vaccine, latex, other medicines, foods, dyes, or preservatives. Different brands of vaccines contain different allergens. Some may contain latex or eggs. Talk to your doctor about your allergies to make sure that you get the right vaccine. -pregnant or trying to get pregnant -breast-feeding How should I use this medicine? This vaccine is for injection into a muscle or under the skin. It is given by a health care professional. A copy of Vaccine Information Statements will be given before each vaccination. Read this sheet carefully each time. The sheet may change frequently. Talk to your healthcare provider to see which vaccines are right for you. Some vaccines should not be used in all age groups. Overdosage: If you think you have taken too much of this medicine contact a poison control center or emergency room at once. NOTE: This medicine is only for you. Do not share this medicine with others. What if I miss a dose? This does not apply. What may interact with this medicine? -chemotherapy or radiation therapy -medicines that lower your immune system like etanercept, anakinra, infliximab, and adalimumab -medicines that treat or prevent blood clots like warfarin -phenytoin -steroid medicines like prednisone or cortisone -theophylline -vaccines This list may not describe all possible interactions. Give your health care provider a list of all the medicines, herbs, non-prescription drugs, or dietary supplements you use. Also tell them if you smoke, drink alcohol, or use illegal drugs. Some items may interact with your medicine.  What should I watch for while using this medicine? Report any side effects that do not go away within 3 days to your doctor or health care professional. Call your health care provider if any unusual symptoms occur within 6 weeks of receiving this  vaccine. You may still catch the flu, but the illness is not usually as bad. You cannot get the flu from the vaccine. The vaccine will not protect against colds or other illnesses that may cause fever. The vaccine is needed every year. What side effects may I notice from receiving this medicine? Side effects that you should report to your doctor or health care professional as soon as possible: -allergic reactions like skin rash, itching or hives, swelling of the face, lips, or tongue Side effects that usually do not require medical attention (report to your doctor or health care professional if they continue or are bothersome): -fever -headache -muscle aches and pains -pain, tenderness, redness, or swelling at the injection site -tiredness This list may not describe all possible side effects. Call your doctor for medical advice about side effects. You may report side effects to FDA at 1-800-FDA-1088. Where should I keep my medicine? The vaccine will be given by a health care professional in a clinic, pharmacy, doctor's office, or other health care setting. You will not be given vaccine doses to store at home. NOTE: This sheet is a summary. It may not cover all possible information. If you have questions about this medicine, talk to your doctor, pharmacist, or health care provider.  2018 Elsevier/Gold Standard (2015-04-12 10:07:28)  Health Maintenance, Female Adopting a healthy lifestyle and getting preventive care can go a long way to promote health and wellness. Talk with your health care provider about what schedule of regular examinations is right for you. This is a good chance for you to check in with your provider about disease prevention and staying healthy. In between checkups, there are plenty of things you can do on your own. Experts have done a lot of research about which lifestyle changes and preventive measures are most likely to keep you healthy. Ask your health care provider for  more information. Weight and diet Eat a healthy diet  Be sure to include plenty of vegetables, fruits, low-fat dairy products, and lean protein.  Do not eat a lot of foods high in solid fats, added sugars, or salt.  Get regular exercise. This is one of the most important things you can do for your health. ? Most adults should exercise for at least 150 minutes each week. The exercise should increase your heart rate and make you sweat (moderate-intensity exercise). ? Most adults should also do strengthening exercises at least twice a week. This is in addition to the moderate-intensity exercise.  Maintain a healthy weight  Body mass index (BMI) is a measurement that can be used to identify possible weight problems. It estimates body fat based on height and weight. Your health care provider can help determine your BMI and help you achieve or maintain a healthy weight.  For females 34 years of age and older: ? A BMI below 18.5 is considered underweight. ? A BMI of 18.5 to 24.9 is normal. ? A BMI of 25 to 29.9 is considered overweight. ? A BMI of 30 and above is considered obese.  Watch levels of cholesterol and blood lipids  You should start having your blood tested for lipids and cholesterol at 81 years of age, then have this test every 5 years.  You may need to have your cholesterol levels checked more often if: ? Your lipid or cholesterol levels are high. ? You are older than 81 years of age. ? You are at high risk for heart disease.  Cancer screening Lung Cancer  Lung cancer screening is recommended for adults 36-79 years old who are at high risk for lung cancer because of a history of smoking.  A yearly low-dose CT scan of the lungs is recommended for people who: ? Currently smoke. ? Have quit within the past 15 years. ? Have at least a 30-pack-year history of smoking. A pack year is smoking an average of one pack of cigarettes a day for 1 year.  Yearly screening should  continue until it has been 15 years since you quit.  Yearly screening should stop if you develop a health problem that would prevent you from having lung cancer treatment.  Breast Cancer  Practice breast self-awareness. This means understanding how your breasts normally appear and feel.  It also means doing regular breast self-exams. Let your health care provider know about any changes, no matter how small.  If you are in your 20s or 30s, you should have a clinical breast exam (CBE) by a health care provider every 1-3 years as part of a regular health exam.  If you are 78 or older, have a CBE every year. Also consider having a breast X-ray (mammogram) every year.  If you have a family history of breast cancer, talk to your health care provider about genetic screening.  If you are at high risk for breast cancer, talk to your health care provider about having an MRI and a mammogram every year.  Breast cancer gene (BRCA) assessment is recommended for women who have family members with BRCA-related cancers. BRCA-related cancers include: ? Breast. ? Ovarian. ? Tubal. ? Peritoneal cancers.  Results of the assessment will determine the need for genetic counseling and BRCA1 and BRCA2 testing.  Cervical Cancer Your health care provider may recommend that you be screened regularly for cancer of the pelvic organs (ovaries, uterus, and vagina). This screening involves a pelvic examination, including checking for microscopic changes to the surface of your cervix (Pap test). You may be encouraged to have this screening done every 3 years, beginning at age 81.  For women ages 23-65, health care providers may recommend pelvic exams and Pap testing every 3 years, or they may recommend the Pap and pelvic exam, combined with testing for human papilloma virus (HPV), every 5 years. Some types of HPV increase your risk of cervical cancer. Testing for HPV may also be done on women of any age with unclear Pap  test results.  Other health care providers may not recommend any screening for nonpregnant women who are considered low risk for pelvic cancer and who do not have symptoms. Ask your health care provider if a screening pelvic exam is right for you.  If you have had past treatment for cervical cancer or a condition that could lead to cancer, you need Pap tests and screening for cancer for at least 20 years after your treatment. If Pap tests have been discontinued, your risk factors (such as having a new sexual partner) need to be reassessed to determine if screening should resume. Some women have medical problems that increase the chance of getting cervical cancer. In these cases, your health care provider may recommend more frequent screening and Pap tests.  Colorectal Cancer  This type of cancer can be detected  and often prevented.  Routine colorectal cancer screening usually begins at 81 years of age and continues through 81 years of age.  Your health care provider may recommend screening at an earlier age if you have risk factors for colon cancer.  Your health care provider may also recommend using home test kits to check for hidden blood in the stool.  A small camera at the end of a tube can be used to examine your colon directly (sigmoidoscopy or colonoscopy). This is done to check for the earliest forms of colorectal cancer.  Routine screening usually begins at age 50.  Direct examination of the colon should be repeated every 5-10 years through 81 years of age. However, you may need to be screened more often if early forms of precancerous polyps or small growths are found.  Skin Cancer  Check your skin from head to toe regularly.  Tell your health care provider about any new moles or changes in moles, especially if there is a change in a mole's shape or color.  Also tell your health care provider if you have a mole that is larger than the size of a pencil eraser.  Always use  sunscreen. Apply sunscreen liberally and repeatedly throughout the day.  Protect yourself by wearing long sleeves, pants, a wide-brimmed hat, and sunglasses whenever you are outside.  Heart disease, diabetes, and high blood pressure  High blood pressure causes heart disease and increases the risk of stroke. High blood pressure is more likely to develop in: ? People who have blood pressure in the high end of the normal range (130-139/85-89 mm Hg). ? People who are overweight or obese. ? People who are African American.  If you are 90-71 years of age, have your blood pressure checked every 3-5 years. If you are 52 years of age or older, have your blood pressure checked every year. You should have your blood pressure measured twice-once when you are at a hospital or clinic, and once when you are not at a hospital or clinic. Record the average of the two measurements. To check your blood pressure when you are not at a hospital or clinic, you can use: ? An automated blood pressure machine at a pharmacy. ? A home blood pressure monitor.  If you are between 64 years and 35 years old, ask your health care provider if you should take aspirin to prevent strokes.  Have regular diabetes screenings. This involves taking a blood sample to check your fasting blood sugar level. ? If you are at a normal weight and have a low risk for diabetes, have this test once every three years after 81 years of age. ? If you are overweight and have a high risk for diabetes, consider being tested at a younger age or more often. Preventing infection Hepatitis B  If you have a higher risk for hepatitis B, you should be screened for this virus. You are considered at high risk for hepatitis B if: ? You were born in a country where hepatitis B is common. Ask your health care provider which countries are considered high risk. ? Your parents were born in a high-risk country, and you have not been immunized against hepatitis B  (hepatitis B vaccine). ? You have HIV or AIDS. ? You use needles to inject street drugs. ? You live with someone who has hepatitis B. ? You have had sex with someone who has hepatitis B. ? You get hemodialysis treatment. ? You take certain medicines for  conditions, including cancer, organ transplantation, and autoimmune conditions.  Hepatitis C  Blood testing is recommended for: ? Everyone born from [birth year] through 1963. ? Anyone with known risk factors for hepatitis C.  Sexually transmitted infections (STIs)  You should be screened for sexually transmitted infections (STIs) including gonorrhea and chlamydia if: ? You are sexually active and are younger than 81 years of age. ? You are older than 81 years of age and your health care provider tells you that you are at risk for this type of infection. ? Your sexual activity has changed since you were last screened and you are at an increased risk for chlamydia or gonorrhea. Ask your health care provider if you are at risk.  If you do not have HIV, but are at risk, it may be recommended that you take a prescription medicine daily to prevent HIV infection. This is called pre-exposure prophylaxis (PrEP). You are considered at risk if: ? You are sexually active and do not regularly use condoms or know the HIV status of your partner(s). ? You take drugs by injection. ? You are sexually active with a partner who has HIV.  Talk with your health care provider about whether you are at high risk of being infected with HIV. If you choose to begin PrEP, you should first be tested for HIV. You should then be tested every 3 months for as long as you are taking PrEP. Pregnancy  If you are premenopausal and you may become pregnant, ask your health care provider about preconception counseling.  If you may become pregnant, take 400 to 800 micrograms (mcg) of folic acid every day.  If you want to prevent pregnancy, talk to your health care provider about  birth control (contraception). Osteoporosis and menopause  Osteoporosis is a disease in which the bones lose minerals and strength with aging. This can result in serious bone fractures. Your risk for osteoporosis can be identified using a bone density scan.  If you are 68 years of age or older, or if you are at risk for osteoporosis and fractures, ask your health care provider if you should be screened.  Ask your health care provider whether you should take a calcium or vitamin D supplement to lower your risk for osteoporosis.  Menopause may have certain physical symptoms and risks.  Hormone replacement therapy may reduce some of these symptoms and risks. Talk to your health care provider about whether hormone replacement therapy is right for you. Follow these instructions at home:  Schedule regular health, dental, and eye exams.  Stay current with your immunizations.  Do not use any tobacco products including cigarettes, chewing tobacco, or electronic cigarettes.  If you are pregnant, do not drink alcohol.  If you are breastfeeding, limit how much and how often you drink alcohol.  Limit alcohol intake to no more than 1 drink per day for nonpregnant women. One drink equals 12 ounces of beer, 5 ounces of [place], or 1 ounces of hard liquor.  Do not use street drugs.  Do not share needles.  Ask your health care provider for help if you need support or information about quitting drugs.  Tell your health care provider if you often feel depressed.  Tell your health care provider if you have ever been abused or do not feel safe at home. This information is not intended to replace advice given to you by your health care provider. Make sure you discuss any questions you have with your health care provider.  Document Released: 04/06/2011 Document Revised: 02/27/2016 Document Reviewed: 06/25/2015 Elsevier Interactive Patient Education  [employer].

## 2018-10-13 ENCOUNTER — Other Ambulatory Visit: Payer: Self-pay | Admitting: Internal Medicine

## 2018-10-21 ENCOUNTER — Emergency Department (HOSPITAL_COMMUNITY): Payer: Medicare Other

## 2018-10-21 ENCOUNTER — Other Ambulatory Visit (INDEPENDENT_AMBULATORY_CARE_PROVIDER_SITE_OTHER): Payer: Medicare Other

## 2018-10-21 ENCOUNTER — Encounter: Payer: Self-pay | Admitting: Nurse Practitioner

## 2018-10-21 ENCOUNTER — Encounter (HOSPITAL_COMMUNITY): Payer: Self-pay

## 2018-10-21 ENCOUNTER — Ambulatory Visit: Payer: Medicare Other | Admitting: Nurse Practitioner

## 2018-10-21 ENCOUNTER — Other Ambulatory Visit: Payer: Self-pay

## 2018-10-21 ENCOUNTER — Emergency Department (HOSPITAL_COMMUNITY)
Admission: EM | Admit: 2018-10-21 | Discharge: 2018-10-21 | Disposition: A | Discharge status: Home / Self Care | Payer: Medicare Other | Attending: Emergency Medicine | Admitting: Emergency Medicine

## 2018-10-21 VITALS — BP 148/82 | HR 106 | Ht 63.0 in | Wt 167.0 lb

## 2018-10-21 DIAGNOSIS — R630 Anorexia: Secondary | ICD-10-CM

## 2018-10-21 DIAGNOSIS — E039 Hypothyroidism, unspecified: Secondary | ICD-10-CM | POA: Insufficient documentation

## 2018-10-21 DIAGNOSIS — I10 Essential (primary) hypertension: Secondary | ICD-10-CM | POA: Insufficient documentation

## 2018-10-21 DIAGNOSIS — Z96653 Presence of artificial knee joint, bilateral: Secondary | ICD-10-CM | POA: Insufficient documentation

## 2018-10-21 DIAGNOSIS — R509 Fever, unspecified: Secondary | ICD-10-CM | POA: Diagnosis present

## 2018-10-21 DIAGNOSIS — N390 Urinary tract infection, site not specified: Secondary | ICD-10-CM | POA: Insufficient documentation

## 2018-10-21 DIAGNOSIS — B028 Zoster with other complications: Secondary | ICD-10-CM | POA: Diagnosis not present

## 2018-10-21 DIAGNOSIS — Z79899 Other long term (current) drug therapy: Secondary | ICD-10-CM | POA: Insufficient documentation

## 2018-10-21 LAB — CBC WITH DIFFERENTIAL/PLATELET
Abs Immature Granulocytes: 0.19 10*3/uL — ABNORMAL HIGH (ref 0.00–0.07)
Basophils Absolute: 0.1 10*3/uL (ref 0.0–0.1)
Basophils Relative: 0 %
Eosinophils Absolute: 0.1 10*3/uL (ref 0.0–0.5)
Eosinophils Relative: 1 %
HEMATOCRIT: 36.1 % (ref 36.0–46.0)
Hemoglobin: 11 g/dL — ABNORMAL LOW (ref 12.0–15.0)
Immature Granulocytes: 1 %
LYMPHS ABS: 2.4 10*3/uL (ref 0.7–4.0)
Lymphocytes Relative: 12 %
MCH: 27.8 pg (ref 26.0–34.0)
MCHC: 30.5 g/dL (ref 30.0–36.0)
MCV: 91.4 fL (ref 80.0–100.0)
Monocytes Absolute: 1.7 10*3/uL — ABNORMAL HIGH (ref 0.1–1.0)
Monocytes Relative: 8 %
Neutro Abs: 16.6 10*3/uL — ABNORMAL HIGH (ref 1.7–7.7)
Neutrophils Relative %: 78 %
Platelets: 365 10*3/uL (ref 150–400)
RBC: 3.95 MIL/uL (ref 3.87–5.11)
RDW: 13.2 % (ref 11.5–15.5)
WBC: 21.1 10*3/uL — ABNORMAL HIGH (ref 4.0–10.5)
nRBC: 0 % (ref 0.0–0.2)

## 2018-10-21 LAB — COMPREHENSIVE METABOLIC PANEL
ALK PHOS: 116 U/L (ref 38–126)
ALT: 18 U/L (ref 0–44)
ANION GAP: 13 (ref 5–15)
AST: 29 U/L (ref 15–41)
Albumin: 3.3 g/dL — ABNORMAL LOW (ref 3.5–5.0)
BUN: 12 mg/dL (ref 8–23)
CALCIUM: 8.6 mg/dL — AB (ref 8.9–10.3)
CO2: 25 mmol/L (ref 22–32)
Chloride: 97 mmol/L — ABNORMAL LOW (ref 98–111)
Creatinine, Ser: 0.88 mg/dL (ref 0.44–1.00)
GFR calc Af Amer: >60 mL/min (ref >60)
GFR calc non Af Amer: >60 mL/min (ref >60)
Glucose, Bld: 125 mg/dL — ABNORMAL HIGH (ref 70–99)
POTASSIUM: 4.4 mmol/L (ref 3.5–5.1)
Sodium: 135 mmol/L (ref 135–145)
TOTAL PROTEIN: 7.1 g/dL (ref 6.5–8.1)
Total Bilirubin: 0.8 mg/dL (ref 0.3–1.2)

## 2018-10-21 LAB — URINALYSIS, ROUTINE W REFLEX MICROSCOPIC
Bacteria, UA: NONE SEEN
Bilirubin Urine: NEGATIVE
GLUCOSE, UA: NEGATIVE mg/dL
Hgb urine dipstick: NEGATIVE
Ketones, ur: 20 mg/dL — AB
Nitrite: NEGATIVE
Protein, ur: NEGATIVE mg/dL
Specific Gravity, Urine: 1.017 (ref 1.005–1.030)
WBC, UA: >50 WBC/hpf — ABNORMAL HIGH (ref 0–5)
pH: 5 (ref 5.0–8.0)

## 2018-10-21 LAB — CBC
HEMATOCRIT: 36.1 % (ref 36.0–46.0)
HEMOGLOBIN: 11.7 g/dL — AB (ref 12.0–15.0)
MCHC: 32.3 g/dL (ref 30.0–36.0)
MCV: 86.9 fl (ref 78.0–100.0)
PLATELETS: 457 10*3/uL — AB (ref 150.0–400.0)
RBC: 4.16 Mil/uL (ref 3.87–5.11)
RDW: 13.7 % (ref 11.5–15.5)

## 2018-10-21 LAB — C-REACTIVE PROTEIN: CRP: 12 mg/dL (ref 0.5–20.0)

## 2018-10-21 LAB — I-STAT CG4 LACTIC ACID, ED: Lactic Acid, Venous: 1.23 mmol/L (ref 0.5–1.9)

## 2018-10-21 LAB — SEDIMENTATION RATE: Sed Rate: 109 mm/hr — ABNORMAL HIGH (ref 0–30)

## 2018-10-21 MED ORDER — CEPHALEXIN 500 MG PO CAPS: 500.0000 mg | ORAL_CAPSULE | Freq: Four times a day (QID) | ORAL | 0 refills | Status: DC

## 2018-10-21 MED ORDER — SODIUM CHLORIDE 0.9 % IV BOLUS
500.0000 mL | Freq: Once | INTRAVENOUS | Status: AC
  Administered 2018-10-21: 500 mL via INTRAVENOUS

## 2018-10-21 MED ORDER — SODIUM CHLORIDE 0.9 % IV SOLN
1.0000 g | Freq: Once | INTRAVENOUS | Status: AC
  Administered 2018-10-21: 1 g via INTRAVENOUS
  Filled 2018-10-21: qty 10

## 2018-10-21 MED ORDER — GABAPENTIN 300 MG PO CAPS: 300.0000 mg | ORAL_CAPSULE | Freq: Every day | ORAL | 1 refills | Status: DC

## 2018-10-21 NOTE — Progress Notes (Signed)
Ashlee Weeks is a 84 y.o. female with the following history as recorded in [employer]:  Patient Active Problem List   Diagnosis Date Noted  . Urinary incontinence 05/19/2018  . Bowel incontinence 05/19/2018  . Routine general medical examination at a health care facility 10/07/2017  . Essential hypertension 10/11/2015  . Hypothyroidism 10/11/2015  . GERD (gastroesophageal reflux disease) 10/11/2015    Current Outpatient Medications  Medication Sig Dispense Refill  . Calcium Carbonate-Vitamin D (CALTRATE 600+D PO) Take by mouth.     . Cholecalciferol (VITAMIN D-3) 1000 units CAPS Take by mouth. Take one by mouth twice daily    . cholestyramine light (PREVALITE) 4 g packet Take 1 packet (4 g total) by mouth 2 (two) times daily. (Patient taking differently: Take 4 g by mouth as needed. ) 60 packet 6  . clindamycin (CLEOCIN T) 1 % lotion Apply topically daily.     . cyanocobalamin 1000 MCG tablet Take 100 mcg by mouth daily.    Marland Kitchen diltiazem (CARDIZEM CD) 180 MG 24 hr capsule Take 1 capsule (180 mg total) by mouth daily. 30 capsule 6  . ibuprofen (ADVIL,MOTRIN) 200 MG tablet Take 200 mg by mouth every 6 (six) hours as needed.    Marland Kitchen levothyroxine (SYNTHROID, LEVOTHROID) 25 MCG tablet Take 1 tablet (25 mcg total) by mouth daily before breakfast. 90 tablet 3  . metoprolol tartrate (LOPRESSOR) 50 MG tablet TAKE 1 TABLET BY MOUTH TWICE A DAY 180 tablet 1  . mirabegron ER (MYRBETRIQ) 50 MG TB24 tablet Take 1 tablet (50 mg total) by mouth daily. 90 tablet 3  . Multiple Vitamins-Minerals (MULTIVITAMIN WITH MINERALS) tablet Take 1 tablet by mouth daily.    Marland Kitchen omeprazole (PRILOSEC) 20 MG capsule Take 1 capsule (20 mg total) by mouth daily. 90 capsule 3  . raloxifene (EVISTA) 60 MG tablet TAKE 1 TABLET BY MOUTH EVERY DAY 90 tablet 2  . ramipril (ALTACE) 10 MG capsule TAKE 1 CAPSULE BY MOUTH 2 TIMES DAILY. 180 capsule 1  . gabapentin (NEURONTIN) 300 MG capsule Take 1 capsule (300 mg total) by mouth at  bedtime. 30 capsule 1   No current facility-administered medications for this visit.     Allergies: Fosamax [alendronate sodium]  Past Medical History:  Diagnosis Date  . Arthritis   . Blood in stool   . Diverticulitis   . GERD (gastroesophageal reflux disease)   . Hypertension   . Thyroid disease   . Urine incontinence     Past Surgical History:  Procedure Laterality Date  . APPENDECTOMY    . CHOLECYSTECTOMY    . EYE SURGERY Right 01/07/2016   artificial lens implant  . EYE SURGERY Left 01/23/2016   artificial lens implant  . REPLACEMENT TOTAL KNEE Bilateral   . THYROID SURGERY     1/2 of thyroid removed  . TONSILLECTOMY AND ADENOIDECTOMY    . TOTAL SHOULDER REPLACEMENT Left 2016  . TUBAL LIGATION      Family History  Problem Relation Age of Onset  . Hypertension Mother   . Heart disease Father   . Hypertension Maternal Aunt   . Miscarriages / Stillbirths Maternal Aunt   . Arthritis Maternal Grandmother   . Alcohol abuse Paternal Grandmother     Social History   Tobacco Use  . Smoking status: Never Smoker  . Smokeless tobacco: Never Used  Substance Use Topics  . Alcohol use: No    Alcohol/week: 0.0 standard drinks     Subjective:  Mr Gheen is here  today requesting follow up of shingles, she was seen for itchy rash to right upper leg and diagnosed with shingles about 6 weeks ago at [facility], completed valtrex course as prescribed and the rash has resolved but she tells me that since she was diagnosed with shingles shes just "not felt well." she reports chills, sweats, decreased appetite, malaise, and an intermittent "severe, fiery, burning, shooting" pain from right shoulder to right side of body. Denies confusion, syncope, headaches, cp, sob, cough, abdominal pain, n/v, abnormal bruising or bleeding, injuries, falls.  ROS- See HPI  Objective:  Vitals:   10/21/18 1353  BP: (!) 148/82  Pulse: (!) 106  SpO2: 92%  Weight: 167 lb (75.8 kg)  Height: 5\' 3"   (1.6 m)    General: Well developed, well nourished, in no acute distress  Skin : Warm and dry. No rash noted. Head: Normocephalic and atraumatic  Eyes: Sclera and conjunctiva clear; pupils round and reactive to light; extraocular movements intact  Oropharynx: Pink, supple. No suspicious lesions  Neck: Supple  Lungs: Respirations unlabored; clear to auscultation bilaterally without wheeze, rales, rhonchi  CVS exam: normal rate and regular rhythm, S1 and S2 normal.  Abdomen: Soft; nontender; nondistended; no masses or hepatosplenomegaly  Musculoskeletal: No deformities; no active joint inflammation  Extremities: No edema, cyanosis, clubbing  Vessels: Symmetric bilaterally  Neurologic: Alert and oriented; speech intact; face symmetrical; moves all extremities well; CNII-XII intact without focal deficit  Psychiatric: Normal mood and affect.   Assessment:  1. Fever, unspecified fever cause   2. Herpes zoster with complication   3. Appetite loss     Plan:   Unclear if symptoms are related to herpes zoster or another cause at this time Start neurontin trial- medication dosing and side effects discussed Check labs Home management, red flags and return precautions including when to seek immediate care discussed and printed on AVS RTC in about 1 week for F/U, check response to neurontin  Return in about 1 week (around 10/28/2018) for follow up.  Orders Placed This Encounter  Procedures  . CBC    Standing Status:   Future    Number of Occurrences:   1    Standing Expiration Date:   10/22/2019  . Sedimentation rate    Standing Status:   Future    Number of Occurrences:   1    Standing Expiration Date:   10/21/2019  . C-reactive protein    Standing Status:   Future    Number of Occurrences:   1    Standing Expiration Date:   10/21/2019    Requested Prescriptions   Signed Prescriptions Disp Refills  . gabapentin (NEURONTIN) 300 MG capsule 30 capsule 1    Sig: Take 1 capsule (300 mg  total) by mouth at bedtime.

## 2018-10-21 NOTE — ED Provider Notes (Signed)
[facility] DEPT Provider Note   CSN: [id number] Arrival date & time: 10/21/18  1658     History   Chief Complaint Chief Complaint  Patient presents with  . Chills  . Flank Pain    r/t shingles    HPI Ashlee Weeks is a 84 y.o. female.  Patient complains of fevers chills.  She was seen by her primary care doctor who did a CBC and it showed an elevated white count so she was sent to the emergency department.  The history is provided by the patient. No language interpreter was used.  Fever  Max temp prior to arrival:  Unknown Temp source:  Subjective Severity:  Moderate Onset quality:  Gradual Timing:  Intermittent Progression:  Waxing and waning Chronicity:  New Relieved by:  Nothing Worsened by:  Nothing Ineffective treatments:  None tried Associated symptoms: no chest pain, no congestion, no cough, no diarrhea, no headaches and no rash   Risk factors: sick contacts     Past Medical History:  Diagnosis Date  . Arthritis   . Blood in stool   . Diverticulitis   . GERD (gastroesophageal reflux disease)   . Hypertension   . Thyroid disease   . Urine incontinence     Patient Active Problem List   Diagnosis Date Noted  . Urinary incontinence 05/19/2018  . Bowel incontinence 05/19/2018  . Routine general medical examination at a health care facility 10/07/2017  . Essential hypertension 10/11/2015  . Hypothyroidism 10/11/2015  . GERD (gastroesophageal reflux disease) 10/11/2015    Past Surgical History:  Procedure Laterality Date  . APPENDECTOMY    . CHOLECYSTECTOMY    . EYE SURGERY Right 01/07/2016   artificial lens implant  . EYE SURGERY Left 01/23/2016   artificial lens implant  . REPLACEMENT TOTAL KNEE Bilateral   . THYROID SURGERY     1/2 of thyroid removed  . TONSILLECTOMY AND ADENOIDECTOMY    . TOTAL SHOULDER REPLACEMENT Left 2016  . TUBAL LIGATION       OB History   No obstetric history on file.      Home  Medications    Prior to Admission medications   Medication Sig Start Date End Date Taking? Authorizing Provider  Calcium Carbonate-Vitamin D (CALTRATE 600+D PO) Take 1 tablet by mouth 3 (three) times a week.    Yes [provider]  Cholecalciferol (VITAMIN D-3) 1000 units CAPS Take 1 capsule by mouth 2 (two) times daily. Take one by mouth twice daily    Yes [provider]  cyanocobalamin 1000 MCG tablet Take 100 mcg by mouth daily.   Yes [provider]  diltiazem (CARDIZEM CD) 180 MG 24 hr capsule Take 1 capsule (180 mg total) by mouth daily. 05/19/18  Yes Hoyt Koch, MD  ibuprofen (ADVIL,MOTRIN) 200 MG tablet Take 400 mg by mouth at bedtime as needed for moderate pain.    Yes [provider]  levothyroxine (SYNTHROID, LEVOTHROID) 25 MCG tablet Take 1 tablet (25 mcg total) by mouth daily before breakfast. 05/19/18  Yes Hoyt Koch, MD  metoprolol tartrate (LOPRESSOR) 50 MG tablet TAKE 1 TABLET BY MOUTH TWICE A DAY 05/06/18  Yes Hoyt Koch, MD  mirabegron ER (MYRBETRIQ) 50 MG TB24 tablet Take 1 tablet (50 mg total) by mouth daily. 05/19/18  Yes Hoyt Koch, MD  Multiple Vitamins-Minerals (MULTIVITAMIN WITH MINERALS) tablet Take 1 tablet by mouth every evening.    Yes [provider]  omeprazole ([place])  20 MG capsule Take 1 capsule (20 mg total) by mouth daily. 05/19/18  Yes Hoyt Koch, MD  raloxifene (EVISTA) 60 MG tablet TAKE 1 TABLET BY MOUTH EVERY DAY 10/13/18  Yes Hoyt Koch, MD  ramipril (ALTACE) 10 MG capsule TAKE 1 CAPSULE BY MOUTH 2 TIMES DAILY. Patient taking differently: Take 10 mg by mouth 2 (two) times daily.  05/20/18  Yes Hoyt Koch, MD  cephALEXin (KEFLEX) 500 MG capsule Take 1 capsule (500 mg total) by mouth 4 (four) times daily. 10/21/18   Milton Ferguson, MD  cholestyramine light (PREVALITE) 4 g packet Take 1 packet (4 g total) by mouth 2 (two) times daily. Patient  taking differently: Take 4 g by mouth daily as needed (diarrhea).  05/19/18   Hoyt Koch, MD  gabapentin (NEURONTIN) 300 MG capsule Take 1 capsule (300 mg total) by mouth at bedtime. 10/21/18   Lance Sell, NP    Family History Family History  Problem Relation Age of Onset  . Hypertension Mother   . Heart disease Father   . Hypertension Maternal Aunt   . Miscarriages / Stillbirths Maternal Aunt   . Arthritis Maternal Grandmother   . Alcohol abuse Paternal Grandmother     Social History Social History   Tobacco Use  . Smoking status: Never Smoker  . Smokeless tobacco: Never Used  Substance Use Topics  . Alcohol use: No    Alcohol/week: 0.0 standard drinks  . Drug use: No     Allergies   Fosamax [alendronate sodium]   Review of Systems Review of Systems  Constitutional: Positive for fever. Negative for appetite change and fatigue.  HENT: Negative for congestion, ear discharge and sinus pressure.   Eyes: Negative for discharge.  Respiratory: Negative for cough.   Cardiovascular: Negative for chest pain.  Gastrointestinal: Negative for abdominal pain and diarrhea.  Genitourinary: Negative for frequency and hematuria.  Musculoskeletal: Negative for back pain.  Skin: Negative for rash.  Neurological: Negative for seizures and headaches.  Psychiatric/Behavioral: Negative for hallucinations.     Physical Exam Updated Vital Signs BP (!) 148/53 (BP Location: Left Arm)   Pulse 93   Temp 99.8 F (37.7 C) (Oral)   Resp 18   Ht 5\' 3"  (1.6 m)   Wt 75.8 kg   SpO2 94%   BMI 29.58 kg/m   Physical Exam Vitals signs and nursing note reviewed.  Constitutional:      Appearance: She is well-developed.  HENT:     Head: Normocephalic.     Nose: Nose normal.  Eyes:     General: No scleral icterus.    Conjunctiva/sclera: Conjunctivae normal.  Neck:     Musculoskeletal: Neck supple.     Thyroid: No thyromegaly.  Cardiovascular:     Rate and Rhythm:  Normal rate and regular rhythm.     Heart sounds: No murmur. No friction rub. No gallop.   Pulmonary:     Breath sounds: No stridor. No wheezing or rales.  Chest:     Chest wall: No tenderness.  Abdominal:     General: There is no distension.     Tenderness: There is no abdominal tenderness. There is no rebound.  Musculoskeletal: Normal range of motion.  Lymphadenopathy:     Cervical: No cervical adenopathy.  Skin:    Findings: No erythema or rash.  Neurological:     Mental Status: She is oriented to person, place, and time.     Motor: No abnormal muscle tone.  Coordination: Coordination normal.  Psychiatric:        Behavior: Behavior normal.      ED Treatments / Results  Labs (all labs ordered are listed, but only abnormal results are displayed) Labs Reviewed  URINALYSIS, ROUTINE W REFLEX MICROSCOPIC - Abnormal; Notable for the following components:      Result Value   APPearance HAZY (*)    Ketones, ur 20 (*)    Leukocytes, UA LARGE (*)    WBC, UA >50 (*)    Non Squamous Epithelial 0-5 (*)    All other components within normal limits  CBC WITH DIFFERENTIAL/PLATELET - Abnormal; Notable for the following components:   WBC 21.1 (*)    Hemoglobin 11.0 (*)    Neutro Abs 16.6 (*)    Monocytes Absolute 1.7 (*)    Abs Immature Granulocytes 0.19 (*)    All other components within normal limits  COMPREHENSIVE METABOLIC PANEL - Abnormal; Notable for the following components:   Chloride 97 (*)    Glucose, Bld 125 (*)    Calcium 8.6 (*)    Albumin 3.3 (*)    All other components within normal limits  URINE CULTURE  CULTURE, BLOOD (ROUTINE X 2)  CULTURE, BLOOD (ROUTINE X 2)  I-STAT CG4 LACTIC ACID, ED    EKG None  Radiology Dg Chest 2 View  Result Date: 10/21/2018 CLINICAL DATA:  Fever.  Elevated white count EXAM: CHEST - 2 VIEW COMPARISON:  None. FINDINGS: Mild bibasilar airspace disease, probably atelectasis. No definite pneumonia. Negative for heart failure  edema or effusion. Left shoulder placement IMPRESSION: Mild bibasilar airspace disease most likely atelectasis. Electronically Signed   By: Franchot Gallo M.D.   On: 10/21/2018 19:28    Procedures Procedures (including critical care time)  Medications Ordered in ED Medications  cefTRIAXone (ROCEPHIN) 1 g in sodium chloride 0.9 % 100 mL IVPB (has no administration in time range)  sodium chloride 0.9 % bolus 500 mL (500 mLs Intravenous New Bag/Given 10/21/18 1846)     Initial Impression / Assessment and Plan / ED Course  I have reviewed the triage vital signs and the nursing notes.  Pertinent labs & imaging results that were available during my care of the patient were reviewed by me and considered in my medical decision making (see chart for details).     Patient with urinary tract infection.  Labs unremarkable with elevated white blood count.  Lactic acid normal vital signs all normal patient not vomiting.  Patient prefers outpatient treatment.  She will be given 1 g Rocephin IV and put on Keflex and will follow-up with her PCP next week  Final Clinical Impressions(s) / ED Diagnoses   Final diagnoses:  Lower urinary tract infectious disease    ED Discharge Orders         Ordered    cephALEXin (KEFLEX) 500 MG capsule  4 times daily     10/21/18 2149           Milton Ferguson, MD 10/21/18 2153

## 2018-10-21 NOTE — ED Triage Notes (Signed)
Pt reports that her PCP sent her to be evaluated because her WBCs were elevated. She also reports chills and night sweats over the last 2 weeks. Pt also finished treatment for shingles in December and reports that she still has pain in her R side. A&Ox4.

## 2018-10-21 NOTE — Patient Instructions (Addendum)
Head downstairs for labs  Start gabapentin 300 once daily at bedtime  Return in 1 week for follow up   Postherpetic Neuralgia Postherpetic neuralgia (PHN) is nerve pain that occurs after a shingles infection. Shingles is a painful rash that appears on one area of the body, usually on the trunk or face. Shingles is caused by the varicella-zoster virus. This is the same virus that causes chickenpox. In people who have had chickenpox, the virus can resurface years later and cause shingles. You may have PHN if you continue to have pain for 4 months after your shingles rash has gone away. PHN appears in the same area where you had the shingles rash. The pain usually goes away after the rash disappears. Getting a vaccination for shingles can prevent PHN. This vaccine is recommended for people older than 60. It may prevent shingles, and may also lower your risk of PHN if you do get shingles. What are the causes? This condition is caused by damage to your nerves from the varicella-zoster virus. The damage makes your nerves overly sensitive. What increases the risk? The following factors may make you more likely to develop this condition:  Being older than 82 years of age.  Having severe pain before your shingles rash starts.  Having a severe rash.  Having shingles in and around the eye area.  Having a disease that makes your body unable to fight infections (weak immune system). What are the signs or symptoms? The main symptom of this condition is pain. The pain may:  Often be very bad and may be described as stabbing, burning, or feeling like an electric shock.  Come and go or may be there all the time.  Be triggered by light touches on the skin or changes in temperature. You may have itching along with the pain. How is this diagnosed? This condition may be diagnosed based on your symptoms and your history of shingles. Lab studies and other diagnostic tests are usually not needed. How is  this treated? There is no cure for this condition. Treatment for PHN will focus on pain relief. Over-the-counter pain relievers do not usually relieve PHN pain. You may need to work with a pain specialist. Treatment may include:  Antidepressant medicines to help with pain and improve sleep.  Anti-seizure medicines to relieve nerve pain.  Strong pain relievers (opioids).  A numbing patch worn on the skin (lidocaine patch).  Botox (botulinum toxin) injections to block pain signals between nerves and muscles.  Injections of numbing medicine or anti-inflammatory medicines around irritated nerves. Follow these instructions at home:   It may take a long time to recover from PHN. Work closely with your health care provider and develop a good support system at home.  Take over-the-counter and prescription medicines only as told by your health care provider.  Do not drive or use heavy machinery while taking prescription pain medicine.  Wear loose, comfortable clothing.  Cover sensitive areas with a dressing to reduce friction from clothing rubbing on the area.  If directed, put ice on the painful area: ? Put ice in a plastic bag. ? Place a towel between your skin and the bag. ? Leave the ice on for 20 minutes, 2-3 times a day.  Talk to your health care provider if you feel depressed or desperate. Living with long-term pain can be depressing.  Keep all follow-up visits as told by your health care provider. This is important. Contact a health care provider if:  Your medicine  is not helping.  You are struggling to manage your pain at home. Summary  Postherpetic neuralgia is a very painful disorder that can occur after an episode of shingles.  The pain is often severe, burning, electric, or stabbing.  Prescription medicines can be helpful in managing persistent pain.  Getting a vaccination for shingles can prevent PHN. This vaccine is recommended for people older than 60. This  information is not intended to replace advice given to you by your health care provider. Make sure you discuss any questions you have with your health care provider. Document Released: 12/12/2002 Document Revised: 12/08/2016 Document Reviewed: 12/08/2016 Elsevier Interactive Patient Education  2019 [employer].

## 2018-10-21 NOTE — ED Notes (Signed)
Bed: XE94 Expected date:  Expected time:  Means of arrival:  Comments: Held: triage 1

## 2018-10-21 NOTE — Discharge Instructions (Signed)
Follow-up with your doctor next week for recheck.  Take Tylenol for any pain or fever.  Return sooner if any problems

## 2018-10-21 NOTE — ED Notes (Signed)
Patient transported to X-ray 

## 2018-10-22 ENCOUNTER — Telehealth (HOSPITAL_BASED_OUTPATIENT_CLINIC_OR_DEPARTMENT_OTHER): Payer: Self-pay | Admitting: Emergency Medicine

## 2018-10-22 LAB — BLOOD CULTURE ID PANEL (REFLEXED)
Acinetobacter baumannii: NOT DETECTED
CANDIDA KRUSEI: NOT DETECTED
Candida albicans: NOT DETECTED
Candida glabrata: NOT DETECTED
Candida parapsilosis: NOT DETECTED
Candida tropicalis: NOT DETECTED
ESCHERICHIA COLI: NOT DETECTED
Enterobacter cloacae complex: NOT DETECTED
Enterobacteriaceae species: NOT DETECTED
Enterococcus species: NOT DETECTED
Haemophilus influenzae: NOT DETECTED
Klebsiella oxytoca: NOT DETECTED
Klebsiella pneumoniae: NOT DETECTED
Listeria monocytogenes: NOT DETECTED
Methicillin resistance: NOT DETECTED
Neisseria meningitidis: NOT DETECTED
Proteus species: NOT DETECTED
Pseudomonas aeruginosa: NOT DETECTED
SERRATIA MARCESCENS: NOT DETECTED
Staphylococcus aureus (BCID): NOT DETECTED
Staphylococcus species: DETECTED — AB
Streptococcus agalactiae: NOT DETECTED
Streptococcus pneumoniae: NOT DETECTED
Streptococcus pyogenes: NOT DETECTED
Streptococcus species: NOT DETECTED

## 2018-10-22 NOTE — ED Provider Notes (Signed)
Called for a positive blood culture with gram-positive cocci suspected to be MRSA.  Patient second blood culture from the right hand is still negative.  Concerned that this is most likely contaminant.   Ashlee Dessert, MD 10/22/18 [phone]

## 2018-10-23 LAB — URINE CULTURE: Culture: NO GROWTH

## 2018-10-24 LAB — CULTURE, BLOOD (ROUTINE X 2)

## 2018-10-25 ENCOUNTER — Telehealth: Payer: Self-pay

## 2018-10-25 NOTE — Telephone Encounter (Signed)
Post ED Visit - Positive Culture Follow-up  Culture report reviewed by antimicrobial stewardship pharmacist:  []  Elenor Quinones, Pharm.D. []  Heide Guile, Pharm.D., BCPS AQ-ID []  Parks Neptune, Pharm.D., BCPS []  Alycia Rossetti, Pharm.D., BCPS []  [place], Pharm.D., BCPS, AAHIVP []  Legrand Como, Pharm.D., BCPS, AAHIVP [x]  Salome Arnt, PharmD, BCPS []  Johnnette Gourd, PharmD, BCPS []  Hughes Better, PharmD, BCPS []  Leeroy Cha, PharmD  Positive blood culture Treated with Cephalexin, organism sensitive to the same and no further patient follow-up is required at this time. [facility] aware  Likely Contaminant Nainika Newlun, Carolynn Comment 10/25/2018, 8:04 AM

## 2018-10-26 ENCOUNTER — Ambulatory Visit: Payer: Medicare Other | Admitting: Nurse Practitioner

## 2018-10-26 LAB — CULTURE, BLOOD (ROUTINE X 2)
Culture: NO GROWTH
SPECIAL REQUESTS: ADEQUATE

## 2018-10-27 ENCOUNTER — Encounter: Payer: Self-pay | Admitting: Nurse Practitioner

## 2018-10-27 ENCOUNTER — Ambulatory Visit: Payer: Medicare Other | Admitting: Nurse Practitioner

## 2018-10-27 ENCOUNTER — Other Ambulatory Visit: Payer: Self-pay

## 2018-10-27 ENCOUNTER — Other Ambulatory Visit (INDEPENDENT_AMBULATORY_CARE_PROVIDER_SITE_OTHER): Payer: Medicare Other

## 2018-10-27 ENCOUNTER — Encounter (HOSPITAL_COMMUNITY): Payer: Self-pay

## 2018-10-27 ENCOUNTER — Emergency Department (HOSPITAL_COMMUNITY): Payer: Medicare Other

## 2018-10-27 ENCOUNTER — Inpatient Hospital Stay (HOSPITAL_COMMUNITY)
Admission: EM | Admit: 2018-10-27 | Discharge: 2018-10-31 | DRG: 436 | Disposition: A | Discharge status: Home Health | Payer: Medicare Other | Attending: Internal Medicine | Admitting: Internal Medicine

## 2018-10-27 VITALS — BP 140/80 | HR 96 | Temp 98.0°F | Ht 63.0 in | Wt 168.0 lb

## 2018-10-27 DIAGNOSIS — Z888 Allergy status to other drugs, medicaments and biological substances status: Secondary | ICD-10-CM

## 2018-10-27 DIAGNOSIS — N390 Urinary tract infection, site not specified: Secondary | ICD-10-CM

## 2018-10-27 DIAGNOSIS — J9811 Atelectasis: Secondary | ICD-10-CM | POA: Diagnosis present

## 2018-10-27 DIAGNOSIS — E039 Hypothyroidism, unspecified: Secondary | ICD-10-CM | POA: Diagnosis present

## 2018-10-27 DIAGNOSIS — Z96612 Presence of left artificial shoulder joint: Secondary | ICD-10-CM | POA: Diagnosis present

## 2018-10-27 DIAGNOSIS — I10 Essential (primary) hypertension: Secondary | ICD-10-CM | POA: Diagnosis present

## 2018-10-27 DIAGNOSIS — R8281 Pyuria: Secondary | ICD-10-CM | POA: Diagnosis not present

## 2018-10-27 DIAGNOSIS — C259 Malignant neoplasm of pancreas, unspecified: Secondary | ICD-10-CM

## 2018-10-27 DIAGNOSIS — Z96653 Presence of artificial knee joint, bilateral: Secondary | ICD-10-CM | POA: Diagnosis present

## 2018-10-27 DIAGNOSIS — Z79899 Other long term (current) drug therapy: Secondary | ICD-10-CM

## 2018-10-27 DIAGNOSIS — C787 Secondary malignant neoplasm of liver and intrahepatic bile duct: Principal | ICD-10-CM | POA: Diagnosis present

## 2018-10-27 DIAGNOSIS — R609 Edema, unspecified: Secondary | ICD-10-CM | POA: Diagnosis not present

## 2018-10-27 DIAGNOSIS — R0689 Other abnormalities of breathing: Secondary | ICD-10-CM

## 2018-10-27 DIAGNOSIS — A419 Sepsis, unspecified organism: Secondary | ICD-10-CM | POA: Diagnosis not present

## 2018-10-27 DIAGNOSIS — R509 Fever, unspecified: Secondary | ICD-10-CM | POA: Diagnosis present

## 2018-10-27 DIAGNOSIS — D72829 Elevated white blood cell count, unspecified: Secondary | ICD-10-CM | POA: Diagnosis not present

## 2018-10-27 DIAGNOSIS — R0902 Hypoxemia: Secondary | ICD-10-CM | POA: Diagnosis present

## 2018-10-27 DIAGNOSIS — K219 Gastro-esophageal reflux disease without esophagitis: Secondary | ICD-10-CM | POA: Diagnosis present

## 2018-10-27 DIAGNOSIS — N3289 Other specified disorders of bladder: Secondary | ICD-10-CM | POA: Diagnosis present

## 2018-10-27 DIAGNOSIS — K8689 Other specified diseases of pancreas: Secondary | ICD-10-CM

## 2018-10-27 LAB — COMPREHENSIVE METABOLIC PANEL
ALT: 25 U/L (ref 0–44)
AST: 28 U/L (ref 15–41)
Albumin: 2.9 g/dL — ABNORMAL LOW (ref 3.5–5.0)
Alkaline Phosphatase: 135 U/L — ABNORMAL HIGH (ref 38–126)
Anion gap: 10 (ref 5–15)
BUN: 10 mg/dL (ref 8–23)
CO2: 27 mmol/L (ref 22–32)
Calcium: 8.5 mg/dL — ABNORMAL LOW (ref 8.9–10.3)
Chloride: 99 mmol/L (ref 98–111)
Creatinine, Ser: 0.79 mg/dL (ref 0.44–1.00)
GFR calc Af Amer: >60 mL/min (ref >60)
Glucose, Bld: 166 mg/dL — ABNORMAL HIGH (ref 70–99)
Potassium: 3.8 mmol/L (ref 3.5–5.1)
Sodium: 136 mmol/L (ref 135–145)
Total Bilirubin: 0.4 mg/dL (ref 0.3–1.2)
Total Protein: 6.8 g/dL (ref 6.5–8.1)

## 2018-10-27 LAB — DIFFERENTIAL
Abs Immature Granulocytes: 0.36 10*3/uL — ABNORMAL HIGH (ref 0.00–0.07)
Basophils Absolute: 0.1 10*3/uL (ref 0.0–0.1)
Basophils Relative: 0 %
Eosinophils Absolute: 0.3 10*3/uL (ref 0.0–0.5)
Eosinophils Relative: 1 %
Immature Granulocytes: 1 %
LYMPHS ABS: 2.2 10*3/uL (ref 0.7–4.0)
Lymphocytes Relative: 8 %
Monocytes Absolute: 1.8 10*3/uL — ABNORMAL HIGH (ref 0.1–1.0)
Monocytes Relative: 6 %
Neutro Abs: 22.7 10*3/uL — ABNORMAL HIGH (ref 1.7–7.7)
Neutrophils Relative %: 84 %

## 2018-10-27 LAB — CBC
HCT: 34.1 % — ABNORMAL LOW (ref 36.0–46.0)
HEMATOCRIT: 33.8 % — AB (ref 36.0–46.0)
Hemoglobin: 10.7 g/dL — ABNORMAL LOW (ref 12.0–15.0)
Hemoglobin: 10.3 g/dL — ABNORMAL LOW (ref 12.0–15.0)
MCH: 27.7 pg (ref 26.0–34.0)
MCHC: 31.8 g/dL (ref 30.0–36.0)
MCHC: 30.2 g/dL (ref 30.0–36.0)
MCV: 85.6 fl (ref 78.0–100.0)
MCV: 91.7 fL (ref 80.0–100.0)
Platelets: 482 10*3/uL — ABNORMAL HIGH (ref 150.0–400.0)
Platelets: 428 10*3/uL — ABNORMAL HIGH (ref 150–400)
RBC: 3.95 Mil/uL (ref 3.87–5.11)
RBC: 3.72 MIL/uL — ABNORMAL LOW (ref 3.87–5.11)
RDW: 14 % (ref 11.5–15.5)
RDW: 13.4 % (ref 11.5–15.5)
WBC: 27.2 10*3/uL (ref 4.0–10.5)
WBC: 25.5 10*3/uL — ABNORMAL HIGH (ref 4.0–10.5)
nRBC: 0 % (ref 0.0–0.2)

## 2018-10-27 LAB — URINALYSIS, ROUTINE W REFLEX MICROSCOPIC
Bilirubin Urine: NEGATIVE
Glucose, UA: NEGATIVE mg/dL
Hgb urine dipstick: NEGATIVE
Ketones, ur: 5 mg/dL — AB
Nitrite: NEGATIVE
PROTEIN: NEGATIVE mg/dL
Specific Gravity, Urine: 1.025 (ref 1.005–1.030)
pH: 5 (ref 5.0–8.0)

## 2018-10-27 LAB — TROPONIN I: Troponin I: <0.03 ng/mL (ref <0.03)

## 2018-10-27 LAB — LIPASE, BLOOD: Lipase: 37 U/L (ref 11–51)

## 2018-10-27 LAB — LACTIC ACID, PLASMA: Lactic Acid, Venous: 1.5 mmol/L (ref 0.5–1.9)

## 2018-10-27 MED ORDER — GABAPENTIN 300 MG PO CAPS
300.0000 mg | ORAL_CAPSULE | Freq: Every day | ORAL | Status: DC
  Administered 2018-10-28 – 2018-10-30 (×3): 300 mg via ORAL
  Filled 2018-10-27 (×3): qty 1

## 2018-10-27 MED ORDER — ONDANSETRON HCL 4 MG PO TABS
4.0000 mg | ORAL_TABLET | Freq: Four times a day (QID) | ORAL | Status: DC | PRN
  Administered 2018-10-28: 4 mg via ORAL
  Filled 2018-10-27: qty 1

## 2018-10-27 MED ORDER — ENOXAPARIN SODIUM 40 MG/0.4ML ~~LOC~~ SOLN
40.0000 mg | Freq: Every day | SUBCUTANEOUS | Status: AC
  Administered 2018-10-28 – 2018-10-31 (×4): 40 mg via SUBCUTANEOUS
  Filled 2018-10-27 (×4): qty 0.4

## 2018-10-27 MED ORDER — VITAMIN B-12 1000 MCG PO TABS
1000.0000 ug | ORAL_TABLET | Freq: Every day | ORAL | Status: DC
  Administered 2018-10-28 – 2018-10-30 (×4): 1000 ug via ORAL
  Filled 2018-10-27 (×4): qty 1

## 2018-10-27 MED ORDER — RALOXIFENE HCL 60 MG PO TABS
60.0000 mg | ORAL_TABLET | Freq: Every day | ORAL | Status: DC
  Administered 2018-10-28 – 2018-10-30 (×4): 60 mg via ORAL
  Filled 2018-10-27 (×4): qty 1

## 2018-10-27 MED ORDER — IOPAMIDOL (ISOVUE-300) INJECTION 61%
100.0000 mL | Freq: Once | INTRAVENOUS | Status: AC | PRN
  Administered 2018-10-27: 100 mL via INTRAVENOUS

## 2018-10-27 MED ORDER — PANTOPRAZOLE SODIUM 40 MG PO TBEC
40.0000 mg | DELAYED_RELEASE_TABLET | Freq: Every day | ORAL | Status: DC
  Administered 2018-10-28 – 2018-10-31 (×4): 40 mg via ORAL
  Filled 2018-10-27 (×4): qty 1

## 2018-10-27 MED ORDER — METOPROLOL TARTRATE 50 MG PO TABS
50.0000 mg | ORAL_TABLET | Freq: Two times a day (BID) | ORAL | Status: DC
  Administered 2018-10-28 – 2018-10-31 (×7): 50 mg via ORAL
  Filled 2018-10-27: qty 2
  Filled 2018-10-27: qty 1
  Filled 2018-10-27: qty 2
  Filled 2018-10-27 (×5): qty 1

## 2018-10-27 MED ORDER — SODIUM CHLORIDE 0.9 % IV SOLN
1.0000 g | Freq: Once | INTRAVENOUS | Status: AC
  Administered 2018-10-27: 1 g via INTRAVENOUS
  Filled 2018-10-27: qty 10

## 2018-10-27 MED ORDER — ONDANSETRON HCL 4 MG/2ML IJ SOLN: 4.0000 mg | Freq: Four times a day (QID) | INTRAMUSCULAR | Status: DC | PRN

## 2018-10-27 MED ORDER — DILTIAZEM HCL ER COATED BEADS 180 MG PO CP24
180.0000 mg | ORAL_CAPSULE | Freq: Every day | ORAL | Status: DC
  Administered 2018-10-28 – 2018-10-31 (×4): 180 mg via ORAL
  Filled 2018-10-27 (×4): qty 1

## 2018-10-27 MED ORDER — HYDROCODONE-ACETAMINOPHEN 5-325 MG PO TABS
1.0000 | ORAL_TABLET | ORAL | Status: DC | PRN
  Administered 2018-10-28 – 2018-10-29 (×4): 1 via ORAL
  Administered 2018-10-30 (×2): 2 via ORAL
  Administered 2018-10-30 – 2018-10-31 (×2): 1 via ORAL
  Filled 2018-10-27 (×2): qty 1
  Filled 2018-10-27: qty 2
  Filled 2018-10-27 (×5): qty 1

## 2018-10-27 MED ORDER — SODIUM CHLORIDE 0.9% FLUSH
3.0000 mL | Freq: Once | INTRAVENOUS | Status: AC
  Administered 2018-10-27: 3 mL via INTRAVENOUS

## 2018-10-27 MED ORDER — LEVOTHYROXINE SODIUM 25 MCG PO TABS
25.0000 ug | ORAL_TABLET | Freq: Every day | ORAL | Status: DC
  Administered 2018-10-28 – 2018-10-31 (×4): 25 ug via ORAL
  Filled 2018-10-27 (×4): qty 1

## 2018-10-27 MED ORDER — ACETAMINOPHEN 325 MG PO TABS
650.0000 mg | ORAL_TABLET | Freq: Once | ORAL | Status: AC
  Administered 2018-10-27: 650 mg via ORAL
  Filled 2018-10-27: qty 2

## 2018-10-27 MED ORDER — SENNOSIDES-DOCUSATE SODIUM 8.6-50 MG PO TABS: 1.0000 | ORAL_TABLET | Freq: Every evening | ORAL | Status: DC | PRN

## 2018-10-27 MED ORDER — RAMIPRIL 10 MG PO CAPS
10.0000 mg | ORAL_CAPSULE | Freq: Two times a day (BID) | ORAL | Status: DC
  Administered 2018-10-28 – 2018-10-31 (×8): 10 mg via ORAL
  Filled 2018-10-27 (×8): qty 1

## 2018-10-27 MED ORDER — CEPHALEXIN 500 MG PO CAPS
500.0000 mg | ORAL_CAPSULE | Freq: Four times a day (QID) | ORAL | Status: DC
  Administered 2018-10-28 (×4): 500 mg via ORAL
  Filled 2018-10-27 (×4): qty 1

## 2018-10-27 NOTE — Progress Notes (Addendum)
Ashlee Weeks is a 84 y.o. female with the following history as recorded in Epic[employer]:  Patient Active Problem List   Diagnosis Date Noted  . Urinary incontinence 05/19/2018  . Bowel incontinence 05/19/2018  . Routine general medical examination at a health care facility 10/07/2017  . Essential hypertension 10/11/2015  . Hypothyroidism 10/11/2015  . GERD (gastroesophageal reflux disease) 10/11/2015    Current Outpatient Medications  Medication Sig Dispense Refill  . Calcium Carbonate-Vitamin D (CALTRATE 600+D PO) Take 1 tablet by mouth 3 (three) times a week.     . cephALEXin (KEFLEX) 500 MG capsule Take 1 capsule (500 mg total) by mouth 4 (four) times daily. 28 capsule 0  . Cholecalciferol (VITAMIN D-3) 1000 units CAPS Take 1 capsule by mouth 2 (two) times daily. Take one by mouth twice daily     . cholestyramine light (PREVALITE) 4 g packet Take 1 packet (4 g total) by mouth 2 (two) times daily. (Patient taking differently: Take 4 g by mouth daily as needed (diarrhea). ) 60 packet 6  . cyanocobalamin 1000 MCG tablet Take 100 mcg by mouth daily.    Marland Kitchen diltiazem (CARDIZEM CD) 180 MG 24 hr capsule Take 1 capsule (180 mg total) by mouth daily. 30 capsule 6  . gabapentin (NEURONTIN) 300 MG capsule Take 1 capsule (300 mg total) by mouth at bedtime. 30 capsule 1  . ibuprofen (ADVIL,MOTRIN) 200 MG tablet Take 400 mg by mouth at bedtime as needed for moderate pain.     Marland Kitchen levothyroxine (SYNTHROID, LEVOTHROID) 25 MCG tablet Take 1 tablet (25 mcg total) by mouth daily before breakfast. 90 tablet 3  . metoprolol tartrate (LOPRESSOR) 50 MG tablet TAKE 1 TABLET BY MOUTH TWICE A DAY 180 tablet 1  . Multiple Vitamins-Minerals (MULTIVITAMIN WITH MINERALS) tablet Take 1 tablet by mouth every evening.     Marland Kitchen omeprazole (PRILOSEC) 20 MG capsule Take 1 capsule (20 mg total) by mouth daily. 90 capsule 3  . raloxifene (EVISTA) 60 MG tablet TAKE 1 TABLET BY MOUTH EVERY DAY 90 tablet 2  . ramipril (ALTACE) 10 MG  capsule TAKE 1 CAPSULE BY MOUTH 2 TIMES DAILY. (Patient taking differently: Take 10 mg by mouth 2 (two) times daily. ) 180 capsule 1  . mirabegron ER (MYRBETRIQ) 50 MG TB24 tablet Take 1 tablet (50 mg total) by mouth daily. (Patient not taking: Reported on 10/27/2018) 90 tablet 3   No current facility-administered medications for this visit.     Allergies: Fosamax [alendronate sodium]  Past Medical History:  Diagnosis Date  . Arthritis   . Blood in stool   . Diverticulitis   . GERD (gastroesophageal reflux disease)   . Hypertension   . Thyroid disease   . Urine incontinence     Past Surgical History:  Procedure Laterality Date  . APPENDECTOMY    . CHOLECYSTECTOMY    . EYE SURGERY Right 01/07/2016   artificial lens implant  . EYE SURGERY Left 01/23/2016   artificial lens implant  . REPLACEMENT TOTAL KNEE Bilateral   . THYROID SURGERY     1/2 of thyroid removed  . TONSILLECTOMY AND ADENOIDECTOMY    . TOTAL SHOULDER REPLACEMENT Left 2016  . TUBAL LIGATION      Family History  Problem Relation Age of Onset  . Hypertension Mother   . Heart disease Father   . Hypertension Maternal Aunt   . Miscarriages / Stillbirths Maternal Aunt   . Arthritis Maternal Grandmother   . Alcohol abuse Paternal Grandmother  Social History   Tobacco Use  . Smoking status: Never Smoker  . Smokeless tobacco: Never Used  Substance Use Topics  . Alcohol use: No    Alcohol/week: 0.0 standard drinks     Subjective:   Ashlee Weeks is here today for follow up of fever, herpes zoster. I saw her on 10/21/18 and she was having persistent chills, sweats, decreased appetite, malaise, and an intermittent "severe, fiery, burning, shooting" pain from right shoulder to right side of body, which had been ongoing since the beginning of December, She had actually been seen at an [facility] at onset of symptoms and given valtrex course which she completed. At her 1/17 OV, CBC, ESR, CRP were collected and prescription  for gabapentin 300 qhs was started for her pain- Her sed rate was elevated and WBC were critically elevated so she was advised to go to ED for further evaluation. Her Ed workup found UTI, she was treated with 1g rocephin IV and discharged home with oral keflex course. It looks like there was discussion around possible admission but she preferred to be discharged home She is here today for follow up, still on keflex, feels somewhat better but still not herself, continues to have fevrs, chills, sweats, decreased appetite, "intermittent fiery pain on my right side and shoulder." no recent abscess, boils, dental procedures. She has not noticed any improvement in pain with neurontin. She has stopped mybetriq because she feels that could have caused her UTI  Review of Systems  Respiratory: Negative for shortness of breath.   Cardiovascular: Negative for chest pain.  Gastrointestinal: Negative for abdominal pain, constipation, diarrhea, nausea and vomiting.  Genitourinary: Negative for dysuria, frequency, hematuria and urgency.  Musculoskeletal: Negative for falls.  Skin: Negative for rash.  Neurological: Negative for speech change and loss of consciousness.  Endo/Heme/Allergies: Does not bruise/bleed easily.  Psychiatric/Behavioral: Negative for memory loss.   Objective:  Vitals:   10/27/18 1533  BP: 140/80  Pulse: 96  Temp: 98 F (36.7 C)  TempSrc: Oral  SpO2: 94%  Weight: 168 lb (76.2 kg)  Height: _0  (1.6 m)    General: Well developed, well nourished, in no acute distress  Skin : Warm and dry. No rash noted. Head: Normocephalic and atraumatic  Eyes: Sclera and conjunctiva clear; pupils round and reactive to light; extraocular movements intact  Oropharynx: Pink, supple. No suspicious lesions  Neck: Supple  Lungs: Respirations unlabored; clear to auscultation bilaterally without wheeze, rales, rhonchi  CVS exam: normal rate and regular rhythm, S1 and S2 normal.  Abdomen: Soft;  nontender; nondistended; no masses or hepatosplenomegaly  Musculoskeletal: No deformities; no active joint inflammation  Extremities: No edema, cyanosis, clubbing  Vessels: Symmetric bilaterally  Neurologic: Alert and oriented; speech intact; face symmetrical; moves all extremities well; CNII-XII intact without focal deficit  Psychiatric: Normal mood and affect.   Assessment:  1. Leukocytosis, unspecified type   2. Fever, unspecified fever cause     Plan:   Recheck CBC today, Imaging ordered for further evaluation of fevers, elevated WBC, continued symptoms Home management, red flags and return precautions including when to seek immediate care discussed and printed on AVS F/U with further recommendations pending test results  Return in about 1 week (around 11/03/2018) for follow up of fevers, elevated WBC count.  Orders Placed This Encounter  Procedures  . CT Abdomen Pelvis W Contrast    Standing Status:   Future    Standing Expiration Date:   01/26/2020    Order Specific  Question:   ** REASON FOR EXAM (FREE TEXT)    Answer:   RUQ pain, fever, elevated WBC coutn    Order Specific Question:   If indicated for the ordered procedure, I authorize the administration of contrast media per Radiology protocol    Answer:   Yes    Order Specific Question:   Preferred imaging location?    Answer:   [address]    Order Specific Question:   Is Oral Contrast requested for this exam?    Answer:   Yes, Per Radiology protocol    Order Specific Question:   Radiology Contrast Protocol - do NOT remove file path    Answer:   [email or web address]  . CBC    Standing Status:   Future    Standing Expiration Date:   10/28/2019  . ECHOCARDIOGRAM COMPLETE    Standing Status:   Future    Standing Expiration Date:   01/26/2020    Order Specific Question:   Where should this test be performed    Answer:   [facility])    Order Specific Question:    Does the patient weigh less than or greater than 250 lbs?    Answer:   Patient weighs less than 250 lbs    Order Specific Question:   Perflutren DEFINITY (image enhancing agent) should be administered unless hypersensitivity or allergy exist    Answer:   Administer Perflutren    Order Specific Question:   Reason for exam-Echo    Answer:   Other-Full Diagnosis List    Order Specific Question:   Full ICD-10/Reason for Exam    Answer:   Fever of unknown origin [195782]    Order Specific Question:   Full ICD-10/Reason for Exam    Answer:   Elevated white blood cell count [350093]    Requested Prescriptions    No prescriptions requested or ordered in this encounter

## 2018-10-27 NOTE — ED Notes (Signed)
Pt given chicken noodle soup and cranberry juice per verbal from [facility], MD

## 2018-10-27 NOTE — Patient Instructions (Signed)
Please head downstairs for labs  You will be contacted to schedule a CT scan of your abdomen and an echocardiogram of your heart   Fever, Adult     A fever is an increase in your body's temperature. It often means a temperature of 100.36F (38C) or higher. Brief mild or moderate fevers often have no long-term effects. They often do not need treatment. Moderate or high fevers may make you feel uncomfortable. Sometimes, they can be a sign of a serious illness or disease. A fever that keeps coming back or that lasts a long time may cause you to lose water in your body (get dehydrated). You can take your temperature with a thermometer to see if you have a fever. Temperature can change with:  Age.  Time of day.  Where the thermometer is put in the body. Readings may vary when the thermometer is put: ? In the mouth (oral). ? In the butt (rectal). ? In the ear (tympanic). ? Under the arm (axillary). ? On the forehead (temporal). Follow these instructions at home: Medicines  Take over-the-counter and prescription medicines only as told by your doctor. Follow the dosing instructions carefully.  If you were prescribed an antibiotic medicine, take it as told by your doctor. Do not stop taking it even if you start to feel better. General instructions  Watch for any changes in your symptoms. Tell your doctor about them.  Rest as needed.  Drink enough fluid to keep your pee (urine) pale yellow.  Sponge yourself or bathe with room-temperature water as needed. This helps to lower your body temperature. Do not use ice water.  Do not use too many blankets or wear clothes that are too heavy.  If your fever was caused by an infection that spreads from person to person (is contagious), such as a cold or the flu: ? You should stay home from work and public places for at least 24 hours after your fever is gone. ? Your fever should be gone for at least 24 hours without the need to use  medicines. Contact a doctor if:  You throw up (vomit).  You cannot eat or drink without throwing up.  You have watery poop (diarrhea).  It hurts when you pee.  Your symptoms do not get better with treatment.  You have new symptoms.  You feel very weak. Get help right away if:  You are short of breath or have trouble breathing.  You are dizzy or you pass out (faint).  You feel mixed up (confused).  You have signs of not having enough water in your body, such as: ? Dark pee, very little pee, or no pee. ? Cracked lips. ? Dry mouth. ? Sunken eyes. ? Sleepiness. ? Weakness.  You have very bad pain in your belly (abdomen).  You keep throwing up or having watery poop.  You have a rash on your skin.  Your symptoms get worse all of a sudden. Summary  A fever is an increase in your body's temperature. It often means a temperature of 100.36F (38C) or higher.  Watch for any changes in your symptoms. Tell your doctor about them.  Take all medicines only as told by your doctor.  Do not go to work or other public places if your fever was caused by an illness that can spread to other people.  Get help right away if you have signs that you do not have enough water in your body. This information is not intended to  replace advice given to you by your health care provider. Make sure you discuss any questions you have with your health care provider. Document Released: 06/30/2008 Document Revised: 03/07/2018 Document Reviewed: 03/07/2018 Elsevier Interactive Patient Education  2019 [employer].

## 2018-10-27 NOTE — H&P (Signed)
H&P        History and Physical    Ashlee Weeks WCB:[id number] DOB: Oct 15, 1934 DOA: 10/27/2018  PCP: Hoyt Koch, MD  Patient coming from: doc office  I have personally briefly reviewed patient's old medical records in [place]  Chief Complaint: abnml labs  HPI: Ashlee Weeks is a 84 y.o. female with medical history significant of hypertension, hypothyroidism presents with abnormal lab work.  For the past 2 months patient has had some new issues.  She was diagnosed with a UTI most recently and was placed on Keflex.  The culture is now negative.  Patient has had diagnosis of shingles about 2 months ago on her right thigh.  Later she started having severe right-sided flank pain and right shoulder pain.  She follow-up with the PCP still noted she has significant leukocytosis and sent her over to the ED.  Once again patient has pyuria.  Low-grade temperature 100.8.  She denies any nausea vomiting chest pain or shortness of breath.  She has had some cough.  Culture and one blood culture were drawn earlier today at the PCPs office.  She has never smoked any cigarettes.  She denies any obvious chemical exposures.  She denies any family history of cancer   ED Course: Patient received a dose of Tylenol and Rocephin in ED.  She had a Chest x-ray done that showed streaky bibasilar atelectasis, and a abdominal pelvic CT that showed a complex mass in the distal tail of the pancreas most consistent with pancreatic malignancy.  There is also satellite lesions.  Also apparent metastatic disease seen.   Review of Systems: Positive for fever chills cough right-sided flank and shoulder pain all others reviewed with patient  and are  negative unless otherwise stated   Past Medical History:  Diagnosis Date  . Arthritis   . Blood in stool   . Diverticulitis   . GERD (gastroesophageal reflux disease)   . Hypertension   . Thyroid disease   . Urine incontinence     Past Surgical History:    Procedure Laterality Date  . APPENDECTOMY    . CHOLECYSTECTOMY    . EYE SURGERY Right 01/07/2016   artificial lens implant  . EYE SURGERY Left 01/23/2016   artificial lens implant  . REPLACEMENT TOTAL KNEE Bilateral   . THYROID SURGERY     1/2 of thyroid removed  . TONSILLECTOMY AND ADENOIDECTOMY    . TOTAL SHOULDER REPLACEMENT Left 2016  . TUBAL LIGATION       reports that she has never smoked. She has never used smokeless tobacco. She reports that she does not drink alcohol or use drugs.  Allergies  Allergen Reactions  . Fosamax [Alendronate Sodium] Other (See Comments)    Joint Swelling    Family History  Problem Relation Age of Onset  . Hypertension Mother   . Heart disease Father   . Hypertension Maternal Aunt   . Miscarriages / Stillbirths Maternal Aunt   . Arthritis Maternal Grandmother   . Alcohol abuse Paternal Grandmother      Prior to Admission medications   Medication Sig Start Date End Date Taking? Authorizing Provider  Calcium Carbonate-Vitamin D (CALTRATE 600+D PO) Take 1 tablet by mouth every Monday, Wednesday, and Friday.    Yes [provider]  cephALEXin (KEFLEX) 500 MG capsule Take 1 capsule (500 mg total) by mouth 4 (four) times daily. 10/21/18  Yes Milton Ferguson, MD  Cholecalciferol (VITAMIN D-3) 1000 units CAPS Take 1,000  Units by mouth 2 (two) times daily. Take one by mouth twice daily    Yes [provider]  cyanocobalamin 1000 MCG tablet Take 1,000 mcg by mouth at bedtime.    Yes [provider]  diltiazem (CARDIZEM CD) 180 MG 24 hr capsule Take 1 capsule (180 mg total) by mouth daily. 05/19/18  Yes Hoyt Koch, MD  gabapentin (NEURONTIN) 300 MG capsule Take 1 capsule (300 mg total) by mouth at bedtime. 10/21/18  Yes Lance Sell, NP  ibuprofen (ADVIL,MOTRIN) 200 MG tablet Take 400 mg by mouth daily as needed for moderate pain.    Yes [provider]  levothyroxine (SYNTHROID, LEVOTHROID) 25 MCG  tablet Take 1 tablet (25 mcg total) by mouth daily before breakfast. 05/19/18  Yes Hoyt Koch, MD  metoprolol tartrate (LOPRESSOR) 50 MG tablet TAKE 1 TABLET BY MOUTH TWICE A DAY Patient taking differently: Take 50 mg by mouth 2 (two) times daily.  05/06/18  Yes Hoyt Koch, MD  Multiple Vitamins-Minerals (MULTIVITAMIN WITH MINERALS) tablet Take 1 tablet by mouth at bedtime.    Yes [provider]  omeprazole (PRILOSEC) 20 MG capsule Take 1 capsule (20 mg total) by mouth daily. 05/19/18  Yes Hoyt Koch, MD  raloxifene (EVISTA) 60 MG tablet TAKE 1 TABLET BY MOUTH EVERY DAY Patient taking differently: Take 60 mg by mouth at bedtime.  10/13/18  Yes Hoyt Koch, MD  ramipril (ALTACE) 10 MG capsule TAKE 1 CAPSULE BY MOUTH 2 TIMES DAILY. Patient taking differently: Take 10 mg by mouth 2 (two) times daily.  05/20/18  Yes Hoyt Koch, MD  cholestyramine light (PREVALITE) 4 g packet Take 1 packet (4 g total) by mouth 2 (two) times daily. Patient not taking: Reported on 10/27/2018 05/19/18   Hoyt Koch, MD  mirabegron ER (MYRBETRIQ) 50 MG TB24 tablet Take 1 tablet (50 mg total) by mouth daily. Patient not taking: Reported on 10/27/2018 05/19/18   Hoyt Koch, MD    Physical Exam: Vitals:   10/27/18 2100 10/27/18 2130 10/27/18 2200 10/27/18 2230  BP: (!) 154/67 (!) 144/93 (!) 154/69 (!) 155/68  Pulse: 92 (!) 109 90 96  Resp: 19 (!) 23 (!) 22 12  Temp:      TempSrc:      SpO2: 95% 95% 93% 93%  Weight:      Height:        Constitutional: NAD, calm, comfortable Vitals:   10/27/18 2100 10/27/18 2130 10/27/18 2200 10/27/18 2230  BP: (!) 154/67 (!) 144/93 (!) 154/69 (!) 155/68  Pulse: 92 (!) 109 90 96  Resp: 19 (!) 23 (!) 22 12  Temp:      TempSrc:      SpO2: 95% 95% 93% 93%  Weight:      Height:       Eyes: PERRL, lids and conjunctivae normal ENMT: Mucous membranes are moist. Posterior pharynx clear of any exudate or  lesions.Normal dentition.  Neck: normal, supple,  Respiratory: Few scattered rales Normal respiratory effort. No accessory muscle use.  Cardiovascular: Regular rate and rhythm, no murmurs / rubs / gallops. No extremity edema. 1+ pedal pulses Abdomen: Positive right upper quadrant tenderness no rebound or guarding. No hepatosplenomegaly. Bowel sounds positive.  Musculoskeletal: no clubbing / cyanosis.  Skin: no rashes, lesions, ulcers. No induration Neurologic: CN 2-12 grossly intact. Strength 5/5 in all 4.  Psychiatric: Normal judgment and insight. Alert and oriented x 3. Normal mood.     Labs  on Admission: I have personally reviewed following labs and imaging studies  CBC: Recent Labs  Lab 10/21/18 1435 10/21/18 1837 10/27/18 1628 10/27/18 1823 10/27/18 1922  WBC 21.5 cH* 21.1* 27.2 Repeated and verified X2.* 25.5*  --   NEUTROABS  --  16.6*  --   --  22.7*  HGB 11.7* 11.0* 10.7* 10.3*  --   HCT 36.1 36.1 33.8* 34.1*  --   MCV 86.9 91.4 85.6 91.7  --   PLT 457.0* 365 482.0* 428*  --    Basic Metabolic Panel: Recent Labs  Lab 10/21/18 1837 10/27/18 1823  NA 135 136  K 4.4 3.8  CL 97* 99  CO2 25 27  GLUCOSE 125* 166*  BUN 12 10  CREATININE 0.88 0.79  CALCIUM 8.6* 8.5*   GFR: Estimated Creatinine Clearance: 53.9 mL/min (by C-G formula based on SCr of 0.79 mg/dL). Liver Function Tests: Recent Labs  Lab 10/21/18 1837 10/27/18 1823  AST 29 28  ALT 18 25  ALKPHOS 116 135*  BILITOT 0.8 0.4  PROT 7.1 6.8  ALBUMIN 3.3* 2.9*   Recent Labs  Lab 10/27/18 1823  LIPASE 37   No results for input(s): AMMONIA in the last 168 hours. Coagulation Profile: No results for input(s): INR, PROTIME in the last 168 hours. Cardiac Enzymes: Recent Labs  Lab 10/27/18 1922  TROPONINI <0.03   BNP (last 3 results) No results for input(s): PROBNP in the last 8760 hours. HbA1C: No results for input(s): HGBA1C in the last 72 hours. CBG: No results for input(s): GLUCAP in the  last 168 hours. Lipid Profile: No results for input(s): CHOL, HDL, LDLCALC, TRIG, CHOLHDL, LDLDIRECT in the last 72 hours. Thyroid Function Tests: No results for input(s): TSH, T4TOTAL, FREET4, T3FREE, THYROIDAB in the last 72 hours. Anemia Panel: No results for input(s): VITAMINB12, FOLATE, FERRITIN, TIBC, IRON, RETICCTPCT in the last 72 hours. Urine analysis:    Component Value Date/Time   COLORURINE YELLOW 10/27/2018 1802   APPEARANCEUR HAZY (A) 10/27/2018 1802   LABSPEC 1.025 10/27/2018 1802   PHURINE 5.0 10/27/2018 1802   GLUCOSEU NEGATIVE 10/27/2018 1802   HGBUR NEGATIVE 10/27/2018 1802   BILIRUBINUR NEGATIVE 10/27/2018 1802   KETONESUR 5 (A) 10/27/2018 1802   PROTEINUR NEGATIVE 10/27/2018 1802   NITRITE NEGATIVE 10/27/2018 1802   LEUKOCYTESUR SMALL (A) 10/27/2018 1802    Radiological Exams on Admission: Dg Chest 2 View  Result Date: 10/27/2018 CLINICAL DATA:  Leukocytosis EXAM: CHEST - 2 VIEW COMPARISON:  10/21/2018 FINDINGS: Streaky atelectasis or scarring at the bases. No pleural effusion. Borderline cardiomegaly. No pneumothorax. Left shoulder replacement. IMPRESSION: Streaky bibasilar atelectasis or scarring Electronically Signed   By: Donavan Foil M.D.   On: 10/27/2018 20:59   Ct Abdomen Pelvis W Contrast  Result Date: 10/27/2018 CLINICAL DATA:  84 year old female with fever of unknown origin. Recent diagnosis of cystitis. EXAM: CT ABDOMEN AND PELVIS WITH CONTRAST TECHNIQUE: Multidetector CT imaging of the abdomen and pelvis was performed using the standard protocol following bolus administration of intravenous contrast. CONTRAST:  147mL ISOVUE-300 IOPAMIDOL (ISOVUE-300) INJECTION 61% COMPARISON:  None. FINDINGS: Lower chest: There are bibasilar linear atelectasis/scarring. There is mild cardiomegaly. Multi vessel coronary vascular calcification. No intra-abdominal free air or free fluid. Hepatobiliary: Multiple (greater than 20) hypodense lesions throughout the liver  measure up to 2.5 x 3.0 cm in the right lobe of the liver most consistent with metastatic disease. There is no intrahepatic biliary ductal dilatation. Cholecystectomy. Pancreas: There is a 3.2 x 3.9 x  4.2 cm complex hypodense/hypoenhancing mass in the distal body/tail of the pancreas most consistent with malignancy. This likely represents a primary neoplasm of the pancreas such as adenoma carcinoma versus less likely metastatic disease to the pancreas. There is atrophy of the pancreatic tail. There is a 2.0 x 2.0 cm rounded density adjacent to the tail of the pancreas in the region of the splenic hilum consistent with a satellite lesion/metastasis. Spleen: Normal in size without focal abnormality. Adrenals/Urinary Tract: There is a 9 mm left adrenal nodule, indeterminate. The right adrenal gland is unremarkable. The kidneys, and the visualized ureters appear unremarkable. The urinary bladder is collapsed. Stomach/Bowel: There is sigmoid diverticulosis without active inflammatory changes. There is a small hiatal hernia. A 2 cm duodenal diverticulum is also noted. There is no bowel obstruction or active inflammation. Appendectomy. Vascular/Lymphatic: There is moderate aortoiliac atherosclerotic disease. No portal venous gas. There is no adenopathy. Reproductive: The uterus and ovaries are grossly unremarkable. Other: None Musculoskeletal: There is multilevel degenerative changes of the spine. There is a transitional vertebra. There is grade 1 L5-S1 anterolisthesis (anterolisthesis of the disc space superior to the transitional vertebra). No acute osseous pathology. IMPRESSION: 1. Complex mass in the distal body/tail of the pancreas most consistent with pancreatic malignancy. There is a satellite lesion/metastasis adjacent to the tail of the pancreas. Further evaluation with pancreatic protocol MRI on a nonemergent basis recommended. 2. Hepatic metastatic disease. 3. Indeterminate left adrenal nodule. 4. Sigmoid  diverticulosis. No bowel obstruction or active inflammation. Electronically Signed   By: Anner Crete M.D.   On: 10/27/2018 21:01    EKG: Independently reviewed.  Sinus rhythm no acute changes  Assessment/Plan Principal Problem:   Sepsis ([place]) Active Problems:   Pancreatic cancer metastasized to liver [facility]) suspected on CT   Essential hypertension   Hypothyroidism   Pyuria   -Sepsis secondary to inflammatory process from likely metastatic pancreatic cancer.  Supportive care.  Will need oncology consultation in the a.m. -Most recent urine culture sterile.  We will continue Keflex follow urine culture drawn earlier today at PCPs office -Continue home medications for hypertension hypothyroidism   DVT prophylaxis: Lovenox Code Status: full  Family Communication:discussed with daughter at bedside   Disposition Plan: Home 2 days Admission status: obs tele   Lonzell Dorris Johnson-Pitts MD [facility] Pager [phone]  If 7PM-7AM, please contact night-coverage [email or web address] Password [facility]  10/27/2018, 11:57 PM

## 2018-10-27 NOTE — ED Triage Notes (Signed)
Patient was seen in the ED 6 days ago and was diagnosed with a bladder infection. Patient followed up with her PCP today and was called to come to the ED due to elevated WBC 27.2. Patient c/o fever chills and night sweats.

## 2018-10-27 NOTE — ED Provider Notes (Signed)
[facility] DEPT Provider Note   CSN: [id number] Arrival date & time: 10/27/18  1745     History   Chief Complaint Chief Complaint  Patient presents with  . Abnormal Lab    HPI Ashlee Weeks is a 84 y.o. female.  HPI  Pt was seen at [place]. Per pt and her family, c/o gradual onset and persistence of constant "high WBC count" for the past 1 week. Pt states since 12/6 she has been experiencing right flank pain. Pt states she did not have and has not had a rash there, no injury. Pt states she also has had "night sweats, morning chills" for the past 1 week. Pt states she was evaluated by her PMD x2 and ED x1, and was told her "WBC count was high" and she had a UTI. Pt states she was also dx with shingles on her right thigh during this time. Pt states she was rx abx and continues to take it as prescribed (LD this morning) without change in her symptoms. Pt's PMD told her that her "WBC count was still elevated today" and she was to go to the ED for further evaluation/admission. Denies CP/SOB, no cough, no abd pain, no N/V/D, no other area/new rash, no objective fever.     Past Medical History:  Diagnosis Date  . Arthritis   . Blood in stool   . Diverticulitis   . GERD (gastroesophageal reflux disease)   . Hypertension   . Thyroid disease   . Urine incontinence     Patient Active Problem List   Diagnosis Date Noted  . Urinary incontinence 05/19/2018  . Bowel incontinence 05/19/2018  . Routine general medical examination at a health care facility 10/07/2017  . Essential hypertension 10/11/2015  . Hypothyroidism 10/11/2015  . GERD (gastroesophageal reflux disease) 10/11/2015    Past Surgical History:  Procedure Laterality Date  . APPENDECTOMY    . CHOLECYSTECTOMY    . EYE SURGERY Right 01/07/2016   artificial lens implant  . EYE SURGERY Left 01/23/2016   artificial lens implant  . REPLACEMENT TOTAL KNEE Bilateral   . THYROID SURGERY     1/2  of thyroid removed  . TONSILLECTOMY AND ADENOIDECTOMY    . TOTAL SHOULDER REPLACEMENT Left 2016  . TUBAL LIGATION       OB History   No obstetric history on file.      Home Medications    Prior to Admission medications   Medication Sig Start Date End Date Taking? Authorizing Provider  Calcium Carbonate-Vitamin D (CALTRATE 600+D PO) Take 1 tablet by mouth 3 (three) times a week.     [provider]  cephALEXin (KEFLEX) 500 MG capsule Take 1 capsule (500 mg total) by mouth 4 (four) times daily. 10/21/18   Milton Ferguson, MD  Cholecalciferol (VITAMIN D-3) 1000 units CAPS Take 1 capsule by mouth 2 (two) times daily. Take one by mouth twice daily     [provider]  cholestyramine light (PREVALITE) 4 g packet Take 1 packet (4 g total) by mouth 2 (two) times daily. Patient taking differently: Take 4 g by mouth daily as needed (diarrhea).  05/19/18   Hoyt Koch, MD  cyanocobalamin 1000 MCG tablet Take 100 mcg by mouth daily.    [provider]  diltiazem (CARDIZEM CD) 180 MG 24 hr capsule Take 1 capsule (180 mg total) by mouth daily. 05/19/18   Hoyt Koch, MD  gabapentin (NEURONTIN) 300 MG capsule Take 1 capsule (300  mg total) by mouth at bedtime. 10/21/18   Lance Sell, NP  ibuprofen (ADVIL,MOTRIN) 200 MG tablet Take 400 mg by mouth at bedtime as needed for moderate pain.     [provider]  levothyroxine (SYNTHROID, LEVOTHROID) 25 MCG tablet Take 1 tablet (25 mcg total) by mouth daily before breakfast. 05/19/18   Hoyt Koch, MD  metoprolol tartrate (LOPRESSOR) 50 MG tablet TAKE 1 TABLET BY MOUTH TWICE A DAY 05/06/18   Hoyt Koch, MD  mirabegron ER (MYRBETRIQ) 50 MG TB24 tablet Take 1 tablet (50 mg total) by mouth daily. Patient not taking: Reported on 10/27/2018 05/19/18   Hoyt Koch, MD  Multiple Vitamins-Minerals (MULTIVITAMIN WITH MINERALS) tablet Take 1 tablet by mouth every evening.     [provider]  omeprazole (PRILOSEC) 20 MG capsule Take 1 capsule (20 mg total) by mouth daily. 05/19/18   Hoyt Koch, MD  raloxifene (EVISTA) 60 MG tablet TAKE 1 TABLET BY MOUTH EVERY DAY 10/13/18   Hoyt Koch, MD  ramipril (ALTACE) 10 MG capsule TAKE 1 CAPSULE BY MOUTH 2 TIMES DAILY. Patient taking differently: Take 10 mg by mouth 2 (two) times daily.  05/20/18   Hoyt Koch, MD    Family History Family History  Problem Relation Age of Onset  . Hypertension Mother   . Heart disease Father   . Hypertension Maternal Aunt   . Miscarriages / Stillbirths Maternal Aunt   . Arthritis Maternal Grandmother   . Alcohol abuse Paternal Grandmother     Social History Social History   Tobacco Use  . Smoking status: Never Smoker  . Smokeless tobacco: Never Used  Substance Use Topics  . Alcohol use: No    Alcohol/week: 0.0 standard drinks  . Drug use: No     Allergies   Fosamax [alendronate sodium]   Review of Systems Review of Systems ROS: Statement: All systems negative except as marked or noted in the HPI; Constitutional: Negative for objective fever and +subjective chills. ; ; Eyes: Negative for eye pain, redness and discharge. ; ; ENMT: Negative for ear pain, hoarseness, nasal congestion, sinus pressure and sore throat. ; ; Cardiovascular: Negative for chest pain, palpitations, diaphoresis, dyspnea and peripheral edema. ; ; Respiratory: Negative for cough, wheezing and stridor. ; ; Gastrointestinal: Negative for nausea, vomiting, diarrhea, abdominal pain, blood in stool, hematemesis, jaundice and rectal bleeding. . ; ; Genitourinary: +flank pain. Negative for dysuria and hematuria. ; ; Musculoskeletal: Negative for neck pain. Negative for swelling and trauma.; ; Skin: Negative for pruritus, rash, abrasions, blisters, bruising and skin lesion.; ; Neuro: Negative for headache, lightheadedness and neck stiffness. Negative for weakness, altered level of  consciousness, altered mental status, extremity weakness, paresthesias, involuntary movement, seizure and syncope.       Physical Exam Updated Vital Signs BP (!) 147/75 (BP Location: Left Arm)   Temp 97.9 F (36.6 C) (Oral)   Resp 16   Ht 5\' 3"  (1.6 m)   Wt 76.2 kg   SpO2 97%   BMI 29.76 kg/m    Patient Vitals for the past 24 hrs:  BP Temp Temp src Pulse Resp SpO2 Height Weight  10/27/18 2100 (!) 154/67 - - 92 19 95 % - -  10/27/18 1945 - (!) 100.8 F (38.2 C) Rectal - - - - -  10/27/18 1758 - - - - - - 5\' 3"  (1.6 m) 76.2 kg  10/27/18 1755 (!) 147/75 97.9 F (36.6 C) Oral -  16 97 % - -     Physical Exam 1925: Physical examination:  Nursing notes reviewed; Vital signs and O2 SAT reviewed;  Constitutional: Well developed, Well nourished, Well hydrated, In no acute distress; Head:  Normocephalic, atraumatic; Eyes: EOMI, PERRL, No scleral icterus; ENMT: Mouth and pharynx normal, Mucous membranes moist; Neck: Supple, Full range of motion, No lymphadenopathy; Cardiovascular: Regular rate and rhythm, No gallop; Respiratory: Breath sounds clear & equal bilaterally, No wheezes.  Speaking full sentences with ease, Normal respiratory effort/excursion; Chest: Nontender, Movement normal; Abdomen: Soft, Nontender, Nondistended, Normal bowel sounds; Genitourinary: No CVA tenderness; Spine:  No midline CS, TS, LS tenderness. No rash.;;  Extremities: Peripheral pulses normal, No tenderness, No edema, No calf edema or asymmetry.; Neuro: AA&Ox3, Major CN grossly intact.  Speech clear. No gross focal motor or sensory deficits in extremities. Climbs on and off stretcher easily by herself. Gait steady..; Skin: Color normal, Warm, Dry.   ED Treatments / Results  Labs (all labs ordered are listed, but only abnormal results are displayed)   EKG EKG Interpretation  Date/Time:  Thursday October 27 2018 19:42:38 EST Ventricular Rate:  87 PR Interval:    QRS Duration: 79 QT Interval:  361 QTC  Calculation: 435 R Axis:   1 Text Interpretation:  Sinus rhythm Low voltage, precordial leads Abnormal R-wave progression, early transition Baseline wander No old tracing to compare Confirmed by Francine Graven [phone]) on 10/27/2018 7:50:42 PM   Radiology   Procedures Procedures (including critical care time)  Medications Ordered in ED Medications  sodium chloride flush (NS) 0.9 % injection 3 mL (has no administration in time range)     Initial Impression / Assessment and Plan / ED Course  I have reviewed the triage vital signs and the nursing notes.  Pertinent labs & imaging results that were available during my care of the patient were reviewed by me and considered in my medical decision making (see chart for details).  MDM Reviewed: previous chart, nursing note and vitals Reviewed previous: labs and ECG Interpretation: labs, ECG, x-ray and CT scan    Results for orders placed or performed during the hospital encounter of 10/27/18  Lipase, blood  Result Value Ref Range   Lipase 37 11 - 51 U/L  Comprehensive metabolic panel  Result Value Ref Range   Sodium 136 135 - 145 mmol/L   Potassium 3.8 3.5 - 5.1 mmol/L   Chloride 99 98 - 111 mmol/L   CO2 27 22 - 32 mmol/L   Glucose, Bld 166 (H) 70 - 99 mg/dL   BUN 10 8 - 23 mg/dL   Creatinine, Ser 0.79 0.44 - 1.00 mg/dL   Calcium 8.5 (L) 8.9 - 10.3 mg/dL   Total Protein 6.8 6.5 - 8.1 g/dL   Albumin 2.9 (L) 3.5 - 5.0 g/dL   AST 28 15 - 41 U/L   ALT 25 0 - 44 U/L   Alkaline Phosphatase 135 (H) 38 - 126 U/L   Total Bilirubin 0.4 0.3 - 1.2 mg/dL   GFR calc non Af Amer >60 >60 mL/min   GFR calc Af Amer >60 >60 mL/min   Anion gap 10 5 - 15  CBC  Result Value Ref Range   WBC 25.5 (H) 4.0 - 10.5 K/uL   RBC 3.72 (L) 3.87 - 5.11 MIL/uL   Hemoglobin 10.3 (L) 12.0 - 15.0 g/dL   HCT 34.1 (L) 36.0 - 46.0 %   MCV 91.7 80.0 - 100.0 fL   MCH 27.7 26.0 -  34.0 pg   MCHC 30.2 30.0 - 36.0 g/dL   RDW 13.4 11.5 - 15.5 %   Platelets 428  (H) 150 - 400 K/uL   nRBC 0.0 0.0 - 0.2 %  Urinalysis, Routine w reflex microscopic  Result Value Ref Range   Color, Urine YELLOW YELLOW   APPearance HAZY (A) CLEAR   Specific Gravity, Urine 1.025 1.005 - 1.030   pH 5.0 5.0 - 8.0   Glucose, UA NEGATIVE NEGATIVE mg/dL   Hgb urine dipstick NEGATIVE NEGATIVE   Bilirubin Urine NEGATIVE NEGATIVE   Ketones, ur 5 (A) NEGATIVE mg/dL   Protein, ur NEGATIVE NEGATIVE mg/dL   Nitrite NEGATIVE NEGATIVE   Leukocytes, UA SMALL (A) NEGATIVE   RBC / HPF 0-5 0 - 5 RBC/hpf   WBC, UA 21-50 0 - 5 WBC/hpf   Bacteria, UA RARE (A) NONE SEEN   Squamous Epithelial / LPF 0-5 0 - 5   Mucus PRESENT    Hyaline Casts, UA PRESENT   Differential  Result Value Ref Range   Neutrophils Relative % 84 %   Neutro Abs 22.7 (H) 1.7 - 7.7 K/uL   Lymphocytes Relative 8 %   Lymphs Abs 2.2 0.7 - 4.0 K/uL   Monocytes Relative 6 %   Monocytes Absolute 1.8 (H) 0.1 - 1.0 K/uL   Eosinophils Relative 1 %   Eosinophils Absolute 0.3 0.0 - 0.5 K/uL   Basophils Relative 0 %   Basophils Absolute 0.1 0.0 - 0.1 K/uL   Immature Granulocytes 1 %   Abs Immature Granulocytes 0.36 (H) 0.00 - 0.07 K/uL  Lactic acid, plasma  Result Value Ref Range   Lactic Acid, Venous 1.5 0.5 - 1.9 mmol/L  Troponin I - Once  Result Value Ref Range   Troponin I <0.03 <0.03 ng/mL   Dg Chest 2 View Result Date: 10/27/2018 CLINICAL DATA:  Leukocytosis EXAM: CHEST - 2 VIEW COMPARISON:  10/21/2018 FINDINGS: Streaky atelectasis or scarring at the bases. No pleural effusion. Borderline cardiomegaly. No pneumothorax. Left shoulder replacement. IMPRESSION: Streaky bibasilar atelectasis or scarring Electronically Signed   By: Donavan Foil M.D.   On: 10/27/2018 20:59    Ct Abdomen Pelvis W Contrast Result Date: 10/27/2018 CLINICAL DATA:  84 year old female with fever of unknown origin. Recent diagnosis of cystitis. EXAM: CT ABDOMEN AND PELVIS WITH CONTRAST TECHNIQUE: Multidetector CT imaging of the abdomen and  pelvis was performed using the standard protocol following bolus administration of intravenous contrast. CONTRAST:  152mL ISOVUE-300 IOPAMIDOL (ISOVUE-300) INJECTION 61% COMPARISON:  None. FINDINGS: Lower chest: There are bibasilar linear atelectasis/scarring. There is mild cardiomegaly. Multi vessel coronary vascular calcification. No intra-abdominal free air or free fluid. Hepatobiliary: Multiple (greater than 20) hypodense lesions throughout the liver measure up to 2.5 x 3.0 cm in the right lobe of the liver most consistent with metastatic disease. There is no intrahepatic biliary ductal dilatation. Cholecystectomy. Pancreas: There is a 3.2 x 3.9 x 4.2 cm complex hypodense/hypoenhancing mass in the distal body/tail of the pancreas most consistent with malignancy. This likely represents a primary neoplasm of the pancreas such as adenoma carcinoma versus less likely metastatic disease to the pancreas. There is atrophy of the pancreatic tail. There is a 2.0 x 2.0 cm rounded density adjacent to the tail of the pancreas in the region of the splenic hilum consistent with a satellite lesion/metastasis. Spleen: Normal in size without focal abnormality. Adrenals/Urinary Tract: There is a 9 mm left adrenal nodule, indeterminate. The right adrenal gland is unremarkable. The kidneys,  and the visualized ureters appear unremarkable. The urinary bladder is collapsed. Stomach/Bowel: There is sigmoid diverticulosis without active inflammatory changes. There is a small hiatal hernia. A 2 cm duodenal diverticulum is also noted. There is no bowel obstruction or active inflammation. Appendectomy. Vascular/Lymphatic: There is moderate aortoiliac atherosclerotic disease. No portal venous gas. There is no adenopathy. Reproductive: The uterus and ovaries are grossly unremarkable. Other: None Musculoskeletal: There is multilevel degenerative changes of the spine. There is a transitional vertebra. There is grade 1 L5-S1 anterolisthesis  (anterolisthesis of the disc space superior to the transitional vertebra). No acute osseous pathology. IMPRESSION: 1. Complex mass in the distal body/tail of the pancreas most consistent with pancreatic malignancy. There is a satellite lesion/metastasis adjacent to the tail of the pancreas. Further evaluation with pancreatic protocol MRI on a nonemergent basis recommended. 2. Hepatic metastatic disease. 3. Indeterminate left adrenal nodule. 4. Sigmoid diverticulosis. No bowel obstruction or active inflammation. Electronically Signed   By: Anner Crete M.D.   On: 10/27/2018 21:01     2215:   Pt taking abx, so no clear UTI on Udip; UC pending. IV rocephin given after BC and UC obtained. WBC count significantly elevated. CT with new pancreatic mass. Dx and testing d/w pt and family.  Questions answered.  Verb understanding, agreeable to admit. T/C returned from [facility] Dr. Wynetta Emery, case discussed, including:  HPI, pertinent PM/SHx, VS/PE, dx testing, ED course and treatment:  Agreeable to admit.     Final Clinical Impressions(s) / ED Diagnoses   Final diagnoses:  None    ED Discharge Orders    None       Francine Graven, DO 10/31/18 1333

## 2018-10-28 ENCOUNTER — Other Ambulatory Visit: Payer: Self-pay

## 2018-10-28 ENCOUNTER — Inpatient Hospital Stay (HOSPITAL_COMMUNITY): Payer: Medicare Other

## 2018-10-28 ENCOUNTER — Other Ambulatory Visit (HOSPITAL_COMMUNITY): Payer: Self-pay | Admitting: Physician Assistant

## 2018-10-28 ENCOUNTER — Encounter (HOSPITAL_COMMUNITY): Payer: Self-pay | Admitting: *Deleted

## 2018-10-28 DIAGNOSIS — C259 Malignant neoplasm of pancreas, unspecified: Secondary | ICD-10-CM

## 2018-10-28 DIAGNOSIS — R509 Fever, unspecified: Secondary | ICD-10-CM | POA: Diagnosis present

## 2018-10-28 DIAGNOSIS — C787 Secondary malignant neoplasm of liver and intrahepatic bile duct: Principal | ICD-10-CM

## 2018-10-28 DIAGNOSIS — R609 Edema, unspecified: Secondary | ICD-10-CM

## 2018-10-28 LAB — CBC
HCT: 32.5 % — ABNORMAL LOW (ref 36.0–46.0)
Hemoglobin: 9.9 g/dL — ABNORMAL LOW (ref 12.0–15.0)
MCH: 27.9 pg (ref 26.0–34.0)
MCHC: 30.5 g/dL (ref 30.0–36.0)
MCV: 91.5 fL (ref 80.0–100.0)
Platelets: 412 10*3/uL — ABNORMAL HIGH (ref 150–400)
RBC: 3.55 MIL/uL — ABNORMAL LOW (ref 3.87–5.11)
RDW: 13.5 % (ref 11.5–15.5)
WBC: 23.4 10*3/uL — ABNORMAL HIGH (ref 4.0–10.5)
nRBC: 0 % (ref 0.0–0.2)

## 2018-10-28 LAB — PROCALCITONIN: Procalcitonin: 0.24 ng/mL

## 2018-10-28 LAB — CREATININE, SERUM
Creatinine, Ser: 0.83 mg/dL (ref 0.44–1.00)
GFR calc Af Amer: >60 mL/min (ref >60)
GFR calc non Af Amer: >60 mL/min (ref >60)

## 2018-10-28 LAB — LACTATE DEHYDROGENASE: LDH: 170 U/L (ref 98–192)

## 2018-10-28 LAB — PHOSPHORUS: Phosphorus: 3.2 mg/dL (ref 2.5–4.6)

## 2018-10-28 LAB — URIC ACID: Uric Acid, Serum: 4 mg/dL (ref 2.5–7.1)

## 2018-10-28 MED ORDER — IOPAMIDOL (ISOVUE-370) INJECTION 76%
INTRAVENOUS | Status: AC
  Filled 2018-10-28: qty 100

## 2018-10-28 MED ORDER — SODIUM CHLORIDE (PF) 0.9 % IJ SOLN
INTRAMUSCULAR | Status: AC
  Administered 2018-10-28: 10 mL
  Filled 2018-10-28: qty 50

## 2018-10-28 MED ORDER — IOPAMIDOL (ISOVUE-370) INJECTION 76%
100.0000 mL | Freq: Once | INTRAVENOUS | Status: AC | PRN
  Administered 2018-10-28: 100 mL via INTRAVENOUS

## 2018-10-28 NOTE — ED Notes (Signed)
MADE AWARE OF BED ASSIGNMENT

## 2018-10-28 NOTE — ED Notes (Signed)
ED TO INPATIENT HANDOFF REPORT  Name/Age/Gender Ashlee Weeks 84 y.o. female  Code Status    Code Status Orders  (From admission, onward)         Start     Ordered   10/27/18 2238  Full code  Continuous     10/27/18 2238        Code Status History    This patient has a current code status but no historical code status.      Home/SNF/Other Home  Chief Complaint blood count severly elevated  Level of Care/Admitting Diagnosis ED Disposition    ED Disposition Condition [facility] [[id number]]  Level of Care: Med-Surg [16]  Diagnosis: Fever [341937]  Admitting Physician: Cristy Folks [[id number]]  Attending Physician: Cristy Folks [[id number]]  Estimated length of stay: past midnight tomorrow  Certification:: I certify this patient will need inpatient services for at least 2 midnights  PT Class (Do Not Modify): Inpatient [101]  PT Acc Code (Do Not Modify): Private [1]       Medical History Past Medical History:  Diagnosis Date  . Arthritis   . Blood in stool   . Diverticulitis   . GERD (gastroesophageal reflux disease)   . Hypertension   . Thyroid disease   . Urine incontinence     Allergies Allergies  Allergen Reactions  . Fosamax [Alendronate Sodium] Other (See Comments)    Joint Swelling    IV Location/Drains/Wounds Patient Lines/Drains/Airways Status   Active Line/Drains/Airways    Name:   Placement date:   Placement time:   Site:   Days:   Peripheral IV 10/27/18 Right Antecubital   10/27/18    2002    Antecubital   1   Peripheral IV 10/28/18 Right Hand   10/28/18    1141    Hand   less than 1          Labs/Imaging Results for orders placed or performed during the hospital encounter of 10/27/18 (from the past 48 hour(s))  Urinalysis, Routine w reflex microscopic     Status: Abnormal   Collection Time: 10/27/18  6:02 PM  Result Value Ref Range   Color, Urine YELLOW YELLOW   APPearance  HAZY (A) CLEAR   Specific Gravity, Urine 1.025 1.005 - 1.030   pH 5.0 5.0 - 8.0   Glucose, UA NEGATIVE NEGATIVE mg/dL   Hgb urine dipstick NEGATIVE NEGATIVE   Bilirubin Urine NEGATIVE NEGATIVE   Ketones, ur 5 (A) NEGATIVE mg/dL   Protein, ur NEGATIVE NEGATIVE mg/dL   Nitrite NEGATIVE NEGATIVE   Leukocytes, UA SMALL (A) NEGATIVE   RBC / HPF 0-5 0 - 5 RBC/hpf   WBC, UA 21-50 0 - 5 WBC/hpf   Bacteria, UA RARE (A) NONE SEEN   Squamous Epithelial / LPF 0-5 0 - 5   Mucus PRESENT    Hyaline Casts, UA PRESENT     Comment: Performed at [facility], [place] [address] [place], [place] [address]  Lipase, blood     Status: None   Collection Time: 10/27/18  6:23 PM  Result Value Ref Range   Lipase 37 11 - 51 U/L    Comment: Performed at [facility], [place] [address] [place], [place] [address]  Comprehensive metabolic panel     Status: Abnormal   Collection Time: 10/27/18  6:23 PM  Result Value Ref Range   Sodium 136 135 - 145 mmol/L  Potassium 3.8 3.5 - 5.1 mmol/L   Chloride 99 98 - 111 mmol/L   CO2 27 22 - 32 mmol/L   Glucose, Bld 166 (H) 70 - 99 mg/dL   BUN 10 8 - 23 mg/dL   Creatinine, Ser 0.79 0.44 - 1.00 mg/dL   Calcium 8.5 (L) 8.9 - 10.3 mg/dL   Total Protein 6.8 6.5 - 8.1 g/dL   Albumin 2.9 (L) 3.5 - 5.0 g/dL   AST 28 15 - 41 U/L   ALT 25 0 - 44 U/L   Alkaline Phosphatase 135 (H) 38 - 126 U/L   Total Bilirubin 0.4 0.3 - 1.2 mg/dL   GFR calc non Af Amer >60 >60 mL/min   GFR calc Af Amer >60 >60 mL/min   Anion gap 10 5 - 15    Comment: Performed at [facility], [place] [address], [place], [place] [address]  CBC     Status: Abnormal   Collection Time: 10/27/18  6:23 PM  Result Value Ref Range   WBC 25.5 (H) 4.0 - 10.5 K/uL   RBC 3.72 (L) 3.87 - 5.11 MIL/uL   Hemoglobin 10.3 (L) 12.0 - 15.0 g/dL   HCT 34.1 (L) 36.0 - 46.0 %   MCV 91.7 80.0 - 100.0 fL   MCH 27.7 26.0 - 34.0 pg   MCHC 30.2 30.0 - 36.0 g/dL   RDW 13.4 11.5  - 15.5 %   Platelets 428 (H) 150 - 400 K/uL   nRBC 0.0 0.0 - 0.2 %    Comment: Performed at [facility], [place] [address], [place], [place] [address]  Differential     Status: Abnormal   Collection Time: 10/27/18  7:22 PM  Result Value Ref Range   Neutrophils Relative % 84 %   Neutro Abs 22.7 (H) 1.7 - 7.7 K/uL   Lymphocytes Relative 8 %   Lymphs Abs 2.2 0.7 - 4.0 K/uL   Monocytes Relative 6 %   Monocytes Absolute 1.8 (H) 0.1 - 1.0 K/uL   Eosinophils Relative 1 %   Eosinophils Absolute 0.3 0.0 - 0.5 K/uL   Basophils Relative 0 %   Basophils Absolute 0.1 0.0 - 0.1 K/uL   Immature Granulocytes 1 %   Abs Immature Granulocytes 0.36 (H) 0.00 - 0.07 K/uL    Comment: Performed at [facility], [place] [address] [place], [place] [address]  Troponin I - Once     Status: None   Collection Time: 10/27/18  7:22 PM  Result Value Ref Range   Troponin I <0.03 <0.03 ng/mL    Comment: Performed at [facility], [place] [address] [place], [place] [address]  Lactic acid, plasma     Status: None   Collection Time: 10/27/18  8:00 PM  Result Value Ref Range   Lactic Acid, Venous 1.5 0.5 - 1.9 mmol/L    Comment: Performed at [facility], [place] [address] [place], [place] [address]  CBC     Status: Abnormal   Collection Time: 10/28/18  1:13 AM  Result Value Ref Range   WBC 23.4 (H) 4.0 - 10.5 K/uL   RBC 3.55 (L) 3.87 - 5.11 MIL/uL   Hemoglobin 9.9 (L) 12.0 - 15.0 g/dL   HCT 32.5 (L) 36.0 - 46.0 %   MCV 91.5 80.0 - 100.0 fL   MCH 27.9 26.0 - 34.0 pg   MCHC 30.5 30.0 - 36.0 g/dL   RDW 13.5 11.5 - 15.5 %  Platelets 412 (H) 150 - 400 K/uL   nRBC 0.0 0.0 - 0.2 %    Comment: Performed at [facility], [place] [address], [place], [place] [address]  Creatinine, serum     Status: None   Collection Time: 10/28/18  1:13 AM  Result Value Ref Range   Creatinine, Ser 0.83 0.44 - 1.00 mg/dL   GFR calc non Af Amer >60 >60  mL/min   GFR calc Af Amer >60 >60 mL/min    Comment: Performed at [facility], [place] [address] [place], [place] [address]  Procalcitonin - Baseline     Status: None   Collection Time: 10/28/18 11:45 AM  Result Value Ref Range   Procalcitonin 0.24 ng/mL    Comment:        Interpretation: PCT (Procalcitonin) <= 0.5 ng/mL: Systemic infection (sepsis) is not likely. Local bacterial infection is possible. (NOTE)       Sepsis PCT Algorithm           Lower Respiratory Tract                                      Infection PCT Algorithm    ----------------------------     ----------------------------         PCT < 0.25 ng/mL                PCT < 0.10 ng/mL         Strongly encourage             Strongly discourage   discontinuation of antibiotics    initiation of antibiotics    ----------------------------     -----------------------------       PCT 0.25 - 0.50 ng/mL            PCT 0.10 - 0.25 ng/mL               OR       >80% decrease in PCT            Discourage initiation of                                            antibiotics      Encourage discontinuation           of antibiotics    ----------------------------     -----------------------------         PCT >= 0.50 ng/mL              PCT 0.26 - 0.50 ng/mL               AND        <80% decrease in PCT             Encourage initiation of                                             antibiotics       Encourage continuation           of antibiotics    ----------------------------     -----------------------------        PCT >= 0.50 ng/mL  PCT > 0.50 ng/mL               AND         increase in PCT                  Strongly encourage                                      initiation of antibiotics    Strongly encourage escalation           of antibiotics                                     -----------------------------                                           PCT <= 0.25 ng/mL                                                  OR                                        > 80% decrease in PCT                                     Discontinue / Do not initiate                                             antibiotics Performed at [place] [address] [place], [place] [address]   Lactate dehydrogenase     Status: None   Collection Time: 10/28/18 11:45 AM  Result Value Ref Range   LDH 170 98 - 192 U/L    Comment: Performed at [facility], [place] [address] [place], [place] [address]  Uric acid     Status: None   Collection Time: 10/28/18 11:45 AM  Result Value Ref Range   Uric Acid, Serum 4.0 2.5 - 7.1 mg/dL    Comment: Performed at [facility], [place] [address] [place], [place] [address]  Phosphorus     Status: None   Collection Time: 10/28/18 11:45 AM  Result Value Ref Range   Phosphorus 3.2 2.5 - 4.6 mg/dL    Comment: Performed at [facility], [place] [address] [place], [place] [address]   Dg Chest 2 View  Result Date: 10/27/2018 CLINICAL DATA:  Leukocytosis EXAM: CHEST - 2 VIEW COMPARISON:  10/21/2018 FINDINGS: Streaky atelectasis or scarring at the bases. No pleural effusion. Borderline cardiomegaly. No pneumothorax. Left shoulder replacement. IMPRESSION: Streaky bibasilar atelectasis or scarring Electronically Signed   By: Donavan Foil M.D.   On: 10/27/2018 20:59   Ct Abdomen Pelvis W Contrast  Result Date: 10/27/2018 CLINICAL DATA:  84 year old female with  fever of unknown origin. Recent diagnosis of cystitis. EXAM: CT ABDOMEN AND PELVIS WITH CONTRAST TECHNIQUE: Multidetector CT imaging of the abdomen and pelvis was performed using the standard protocol following bolus administration of intravenous contrast. CONTRAST:  181mL ISOVUE-300 IOPAMIDOL (ISOVUE-300) INJECTION 61% COMPARISON:  None. FINDINGS: Lower chest: There are bibasilar linear atelectasis/scarring. There is mild cardiomegaly. Multi vessel  coronary vascular calcification. No intra-abdominal free air or free fluid. Hepatobiliary: Multiple (greater than 20) hypodense lesions throughout the liver measure up to 2.5 x 3.0 cm in the right lobe of the liver most consistent with metastatic disease. There is no intrahepatic biliary ductal dilatation. Cholecystectomy. Pancreas: There is a 3.2 x 3.9 x 4.2 cm complex hypodense/hypoenhancing mass in the distal body/tail of the pancreas most consistent with malignancy. This likely represents a primary neoplasm of the pancreas such as adenoma carcinoma versus less likely metastatic disease to the pancreas. There is atrophy of the pancreatic tail. There is a 2.0 x 2.0 cm rounded density adjacent to the tail of the pancreas in the region of the splenic hilum consistent with a satellite lesion/metastasis. Spleen: Normal in size without focal abnormality. Adrenals/Urinary Tract: There is a 9 mm left adrenal nodule, indeterminate. The right adrenal gland is unremarkable. The kidneys, and the visualized ureters appear unremarkable. The urinary bladder is collapsed. Stomach/Bowel: There is sigmoid diverticulosis without active inflammatory changes. There is a small hiatal hernia. A 2 cm duodenal diverticulum is also noted. There is no bowel obstruction or active inflammation. Appendectomy. Vascular/Lymphatic: There is moderate aortoiliac atherosclerotic disease. No portal venous gas. There is no adenopathy. Reproductive: The uterus and ovaries are grossly unremarkable. Other: None Musculoskeletal: There is multilevel degenerative changes of the spine. There is a transitional vertebra. There is grade 1 L5-S1 anterolisthesis (anterolisthesis of the disc space superior to the transitional vertebra). No acute osseous pathology. IMPRESSION: 1. Complex mass in the distal body/tail of the pancreas most consistent with pancreatic malignancy. There is a satellite lesion/metastasis adjacent to the tail of the pancreas. Further  evaluation with pancreatic protocol MRI on a nonemergent basis recommended. 2. Hepatic metastatic disease. 3. Indeterminate left adrenal nodule. 4. Sigmoid diverticulosis. No bowel obstruction or active inflammation. Electronically Signed   By: Anner Crete M.D.   On: 10/27/2018 21:01   EKG Interpretation  Date/Time:  Thursday October 27 2018 19:42:38 EST Ventricular Rate:  87 PR Interval:    QRS Duration: 79 QT Interval:  361 QTC Calculation: 435 R Axis:   1 Text Interpretation:  Sinus rhythm Low voltage, precordial leads Abnormal R-wave progression, early transition Baseline wander No old tracing to compare Confirmed by Francine Graven [phone]) on 10/27/2018 7:50:42 PM Also confirmed by Francine Graven [phone]), editor Philomena Doheny [phone])  on 10/28/2018 7:27:37 AM   Pending Labs Unresulted Labs (From admission, onward)    Start     Ordered   11/03/18 0500  Creatinine, serum  (enoxaparin (LOVENOX)    CrCl >/= 30 ml/min)  Weekly,   R    Comments:  while on enoxaparin therapy    10/27/18 2238   10/29/18 0500  CBC with Differential/Platelet  Tomorrow morning,   R     10/28/18 0938   10/29/18 0500  Comprehensive metabolic panel  Tomorrow morning,   R     10/28/18 0938   10/29/18 0500  Procalcitonin  Daily,   R     10/28/18 0938   10/29/18 0500  Magnesium  Tomorrow morning,   R     10/28/18 0973  10/27/18 1922  Urine culture  ONCE - STAT,   STAT     10/27/18 1921   10/27/18 1922  Culture, blood (routine x 2)  BLOOD CULTURE X 2,   STAT     10/27/18 1921   10/27/18 1922  Lactic acid, plasma  Now then every 2 hours,   STAT     10/27/18 1921          Vitals/Pain Today's Vitals   10/28/18 1403 10/28/18 1407 10/28/18 1407 10/28/18 1429  BP:      Pulse: 86   80  Resp: (!) 21   (!) 21  Temp:  (!) 101 F (38.3 C)    TempSrc:  Oral    SpO2: 92%   93%  Weight:      Height:      PainSc:   0-No pain     Isolation Precautions No active  isolations  Medications Medications  cephALEXin (KEFLEX) capsule 500 mg (500 mg Oral Given 10/28/18 1107)  vitamin B-12 (CYANOCOBALAMIN) tablet 1,000 mcg (1,000 mcg Oral Given 10/28/18 0106)  metoprolol tartrate (LOPRESSOR) tablet 50 mg (50 mg Oral Given 10/28/18 1107)  levothyroxine (SYNTHROID, LEVOTHROID) tablet 25 mcg (25 mcg Oral Given 10/28/18 0635)  pantoprazole (PROTONIX) EC tablet 40 mg (40 mg Oral Given 10/28/18 1108)  diltiazem (CARDIZEM CD) 24 hr capsule 180 mg (180 mg Oral Given 10/28/18 1109)  ramipril (ALTACE) capsule 10 mg (10 mg Oral Given 10/28/18 1108)  raloxifene (EVISTA) tablet 60 mg (60 mg Oral Given 10/28/18 0106)  gabapentin (NEURONTIN) capsule 300 mg (300 mg Oral Refused 10/28/18 0108)  enoxaparin (LOVENOX) injection 40 mg (40 mg Subcutaneous Given 10/28/18 1108)  HYDROcodone-acetaminophen (NORCO/VICODIN) 5-325 MG per tablet 1-2 tablet (1 tablet Oral Given 10/28/18 1237)  senna-docusate (Senokot-S) tablet 1 tablet (has no administration in time range)  ondansetron (ZOFRAN) tablet 4 mg (has no administration in time range)    Or  ondansetron (ZOFRAN) injection 4 mg (has no administration in time range)  sodium chloride flush (NS) 0.9 % injection 3 mL (3 mLs Intravenous Given 10/27/18 2107)  acetaminophen (TYLENOL) tablet 650 mg (650 mg Oral Given 10/27/18 2013)  iopamidol (ISOVUE-300) 61 % injection 100 mL (100 mLs Intravenous Contrast Given 10/27/18 2033)  cefTRIAXone (ROCEPHIN) 1 g in sodium chloride 0.9 % 100 mL IVPB (0 g Intravenous Stopped 10/27/18 2234)    Mobility walks

## 2018-10-28 NOTE — ED Notes (Signed)
PT REPOSITIONED AND ENCOURAGED TO COUGH AND DEEP BREATH, FAMILY PRESENT TO ASSIST

## 2018-10-28 NOTE — Progress Notes (Signed)
Bilateral lower extremities venous duplex exam completed. Please see preliminary notes on CV PROC under chart review. Ashlee Weeks H Makiya Jeune(RDMS RVT) 10/28/18 4:18 PM

## 2018-10-28 NOTE — ED Notes (Addendum)
NPO CHIPS AND SIPS FOR PROCEDURE

## 2018-10-28 NOTE — Progress Notes (Signed)
  Request for image guided biopsy of liver seen.  IR is unable to accommodate request until Wednesday, January 29th.  I have discussed this with ordering MD.  I have also discussed this with the patient.  Will plan for US guided biopsy of liver lesion on Wednesday, January 29th.  Written instructions given to patient.  I will place a separate outpatient order in case patient is discharged prior to Wednesday.  WENDY S BLAIR PA-C 10/28/2018 10:42 AM

## 2018-10-28 NOTE — ED Notes (Signed)
Date and time results received: 10/28/18  (use smartphrase ".now" to insert current time)  Test: TEMP 101 Critical Value: ELEVATED TEMP Name of Provider Notified: Wilmon Arms MD  Orders Received? Or Actions Taken?: Actions Taken: NO ACTION TAKEN

## 2018-10-28 NOTE — ED Notes (Signed)
Pharmacy called to verify medication 

## 2018-10-28 NOTE — Progress Notes (Signed)
PROGRESS NOTE    Ashlee Weeks  KNL:[id number] DOB: 1935-03-29 DOA: 10/27/2018 PCP: Hoyt Koch, MD  Brief Narrative:  84-year-old with past medical history relevant for hypertension, hypothyroidism who presents with 1 to 2 weeks of fevers and very elevated white blood cell count and found to have likely pancreatic cancer of the tail with metastases to the liver.   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  Have low suspicion for active infection at this time.  She apparently was diagnosed with UTI on 10/21/2018 when she presented with similar symptoms however this urine culture has been negative.  Her blood culture from that emergency visit on 10/21/2018 were negative as well.  Suspect this is almost certainly related to her underlying pancreatic cancer.  She has no evidence of biliary obstruction either on labs or on exam. -Procalcitonin -Continue p.o. cephalexin -Blood cultures on 10/27/2018 no growth to date -Urine cultures on 10/27/2018 no growth to date  #) Likely metastatic pancreatic cancer: CT scan shows evidence of tumor at the tail of the pancreas with metastases to the liver.  Patient has had weight loss consistent with this. -Plan for interventional radiology to perform IR guided biopsy likely as an outpatient on Wednesday, 11/02/2018 -We will discuss with oncology  #) hypertension: -Continue diltiazem 180 mg daily -Continue metoprolol tartrate 50 mg twice daily -Continue ramipril 10 mg twice daily  #) Hypothyroidism: -Continue levothyroxine 25 mcg  daily  #) Bladder spasms: -Continue mirabegron 50 mg daily  #) Pain/psych: -Continue gabapentin 3 mg nightly  Fluids: Tolerating p.o. Electrolytes: Monitor and supplement Nutrition: Heart healthy diet  Prophylaxis: Enoxaparin  Disposition: Pending ruling out serious bacterial infection  Full code   Consultants:   Interventional radiology  Oncology  Procedures:   None  Antimicrobials:   Oral cephalexin   Subjective: This morning patient reports she is doing fairly well.  She is come to accept the fact that she likely has metastatic cancer.  She reports her primary complaint is the fact that she has had recurrent fevers.  It is not clear what the etiology of these are.  She denies any nausea, vomiting, diarrhea, cough, congestion.  She does admit to approximately 14 pound weight loss over approximately 2 weeks.  Objective: Vitals:   10/28/18 0800 10/28/18 0900 10/28/18 0928 10/28/18 1100  BP: (!) 165/56 (!) 151/59 (!) 151/59 (!) 150/73  Pulse: 91 95 87 87  Resp: 20 20 (!) 23   Temp:      TempSrc:      SpO2: 91% 95% 91% 93%  Weight:      Height:       No intake or output data in the 24 hours ending 10/28/18 1142 Filed Weights   10/27/18 1758  Weight: 76.2 kg    Examination:  General exam: Appears calm and comfortable  Respiratory system: Clear to auscultation. Respiratory effort normal. Cardiovascular system: Regular rate and rhythm, no murmurs Gastrointestinal system: Abdomen is nondistended, soft and nontender. No organomegaly or masses felt. Normal bowel sounds heard. Central nervous system: Alert and oriented.  Is intact, moving all extremities Extremities:  Lower extremity edema. Skin: No rashes visible skin Psychiatry: Judgement and insight appear normal. Mood & affect appropriate.     Data Reviewed: I have personally reviewed following labs and imaging studies  CBC: Recent Labs  Lab 10/21/18 1435 10/21/18 1837 10/27/18 1628 10/27/18 1823  10/27/18 1922 10/28/18 0113  WBC 21.5 cH* 21.1* 27.2 Repeated and verified X2.* 25.5*  --  23.4*  NEUTROABS  --  16.6*  --   --  22.7*  --   HGB 11.7* 11.0* 10.7* 10.3*  --  9.9*  HCT 36.1 36.1 33.8* 34.1*  --  32.5*  MCV 86.9 91.4 85.6 91.7  --  91.5  PLT 457.0* 365 482.0* 428*  --  768*   Basic Metabolic Panel: Recent Labs  Lab 10/21/18 1837 10/27/18 1823 10/28/18 0113  NA 135 136  --   K 4.4 3.8  --   CL 97* 99  --   CO2 25 27  --   GLUCOSE 125* 166*  --   BUN 12 10  --   CREATININE 0.88 0.79 0.83  CALCIUM 8.6* 8.5*  --    GFR: Estimated Creatinine Clearance: 51.9 mL/min (by C-G formula based on SCr of 0.83 mg/dL). Liver Function Tests: Recent Labs  Lab 10/21/18 1837 10/27/18 1823  AST 29 28  ALT 18 25  ALKPHOS 116 135*  BILITOT 0.8 0.4  PROT 7.1 6.8  ALBUMIN 3.3* 2.9*   Recent Labs  Lab 10/27/18 1823  LIPASE 37   No results for input(s): AMMONIA in the last 168 hours. Coagulation Profile: No results for input(s): INR, PROTIME in the last 168 hours. Cardiac Enzymes: Recent Labs  Lab 10/27/18 1922  TROPONINI <0.03   BNP (last 3 results) No results for input(s): PROBNP in the last 8760 hours. HbA1C: No results for input(s): HGBA1C in the last 72 hours. CBG: No results for input(s): GLUCAP in the last 168 hours. Lipid Profile: No results for input(s): CHOL, HDL, LDLCALC, TRIG, CHOLHDL, LDLDIRECT in the last 72 hours. Thyroid Function Tests: No results for input(s): TSH, T4TOTAL, FREET4, T3FREE, THYROIDAB in the last 72 hours. Anemia Panel: No results for input(s): VITAMINB12, FOLATE, FERRITIN, TIBC, IRON, RETICCTPCT in the last 72 hours. Sepsis Labs: Recent Labs  Lab 10/21/18 1843 10/27/18 2000  LATICACIDVEN 1.23 1.5    Recent Results (from the past 240 hour(s))  Blood culture (routine x 2)     Status: None   Collection Time: 10/21/18  6:26 PM  Result Value Ref Range Status   Specimen Description   Final    BLOOD RIGHT HAND Performed at  [facility] [address], [place], [place] [address]    Special Requests   Final    BOTTLES DRAWN AEROBIC AND ANAEROBIC Blood Culture adequate volume Performed at [facility] [address] [place], [place] [address]    Culture   Final    NO GROWTH 5 DAYS Performed at [facility], [place] [address], [place], [place] [address]    Report Status 10/26/2018 FINAL  Final  Blood culture (routine x 2)     Status: Abnormal   Collection Time: 10/21/18  6:37 PM  Result Value Ref Range Status   Specimen Description   Final    BLOOD LEFT HAND Performed at [facility] [address], [place], [place] [address]    Special Requests   Final    BOTTLES DRAWN AEROBIC ONLY Blood Culture results may not be optimal  due to an inadequate volume of blood received in culture bottles Performed at [facility] [address], [place], [place] [address]    Culture  Setup Time   Final    GRAM POSITIVE COCCI IN CLUSTERS AEROBIC BOTTLE ONLY CRITICAL RESULT CALLED TO, READ BACK BY AND VERIFIED WITH: S. GOUGE, RN ([place]) AT [place] ON 10/22/18 BY C. JESSUP, MLT.    Culture (A)  Final    STAPHYLOCOCCUS SPECIES (COAGULASE NEGATIVE) THE SIGNIFICANCE OF ISOLATING THIS ORGANISM FROM A SINGLE SET OF BLOOD CULTURES WHEN MULTIPLE SETS ARE DRAWN IS UNCERTAIN. PLEASE NOTIFY THE MICROBIOLOGY DEPARTMENT WITHIN ONE WEEK IF SPECIATION AND SENSITIVITIES ARE REQUIRED. Performed at [facility], [place] [address] [place], [place] [address]    Report Status 10/24/2018 FINAL  Final  Blood Culture ID Panel (Reflexed)     Status: Abnormal   Collection Time: 10/21/18  6:37 PM  Result Value Ref Range Status   Enterococcus species NOT DETECTED NOT DETECTED Final   Listeria monocytogenes NOT DETECTED NOT DETECTED Final   Staphylococcus species DETECTED (A) NOT DETECTED Final    Comment: Methicillin (oxacillin) susceptible coagulase negative  staphylococcus. Possible blood culture contaminant (unless isolated from more than one blood culture draw or clinical case suggests pathogenicity). No antibiotic treatment is indicated for blood  culture contaminants. CRITICAL RESULT CALLED TO, READ BACK BY AND VERIFIED WITH: S. GOUGE, RN ([facility]) AT 5366 ON 10/22/18 BY C. JESSUP, MLT.    Staphylococcus aureus (BCID) NOT DETECTED NOT DETECTED Final   Methicillin resistance NOT DETECTED NOT DETECTED Final   Streptococcus species NOT DETECTED NOT DETECTED Final   Streptococcus agalactiae NOT DETECTED NOT DETECTED Final   Streptococcus pneumoniae NOT DETECTED NOT DETECTED Final   Streptococcus pyogenes NOT DETECTED NOT DETECTED Final   Acinetobacter baumannii NOT DETECTED NOT DETECTED Final   Enterobacteriaceae species NOT DETECTED NOT DETECTED Final   Enterobacter cloacae complex NOT DETECTED NOT DETECTED Final   Escherichia coli NOT DETECTED NOT DETECTED Final   Klebsiella oxytoca NOT DETECTED NOT DETECTED Final   Klebsiella pneumoniae NOT DETECTED NOT DETECTED Final   Proteus species NOT DETECTED NOT DETECTED Final   Serratia marcescens NOT DETECTED NOT DETECTED Final   Haemophilus influenzae NOT DETECTED NOT DETECTED Final   Neisseria meningitidis NOT DETECTED NOT DETECTED Final   Pseudomonas aeruginosa NOT DETECTED NOT DETECTED Final   Candida albicans NOT DETECTED NOT DETECTED Final   Candida glabrata NOT DETECTED NOT DETECTED Final   Candida krusei NOT DETECTED NOT DETECTED Final   Candida parapsilosis NOT DETECTED NOT DETECTED Final   Candida tropicalis NOT DETECTED NOT DETECTED Final    Comment: Performed at [facility], [place] [address], [place], [place] [address]  Urine culture     Status: None   Collection Time: 10/21/18  7:59 PM  Result Value Ref Range Status   Specimen Description   Final    URINE, RANDOM Performed at [facility] [address], [place], [place] [address]    Special Requests    Final    NONE Performed at [facility], [place] [address], [place], [place] [address]    Culture   Final    NO GROWTH Performed at [facility], [place] [address] [place], [place] [address]    Report Status 10/23/2018 FINAL  Final         Radiology Studies: Dg Chest 2 View  Result Date: 10/27/2018 CLINICAL DATA:  Leukocytosis EXAM:  CHEST - 2 VIEW COMPARISON:  10/21/2018 FINDINGS: Streaky atelectasis or scarring at the bases. No pleural effusion. Borderline cardiomegaly. No pneumothorax. Left shoulder replacement. IMPRESSION: Streaky bibasilar atelectasis or scarring Electronically Signed   By: Donavan Foil M.D.   On: 10/27/2018 20:59   Ct Abdomen Pelvis W Contrast  Result Date: 10/27/2018 CLINICAL DATA:  84 year old female with fever of unknown origin. Recent diagnosis of cystitis. EXAM: CT ABDOMEN AND PELVIS WITH CONTRAST TECHNIQUE: Multidetector CT imaging of the abdomen and pelvis was performed using the standard protocol following bolus administration of intravenous contrast. CONTRAST:  114mL ISOVUE-300 IOPAMIDOL (ISOVUE-300) INJECTION 61% COMPARISON:  None. FINDINGS: Lower chest: There are bibasilar linear atelectasis/scarring. There is mild cardiomegaly. Multi vessel coronary vascular calcification. No intra-abdominal free air or free fluid. Hepatobiliary: Multiple (greater than 20) hypodense lesions throughout the liver measure up to 2.5 x 3.0 cm in the right lobe of the liver most consistent with metastatic disease. There is no intrahepatic biliary ductal dilatation. Cholecystectomy. Pancreas: There is a 3.2 x 3.9 x 4.2 cm complex hypodense/hypoenhancing mass in the distal body/tail of the pancreas most consistent with malignancy. This likely represents a primary neoplasm of the pancreas such as adenoma carcinoma versus less likely metastatic disease to the pancreas. There is atrophy of the pancreatic tail. There is a 2.0 x 2.0 cm rounded density adjacent to the  tail of the pancreas in the region of the splenic hilum consistent with a satellite lesion/metastasis. Spleen: Normal in size without focal abnormality. Adrenals/Urinary Tract: There is a 9 mm left adrenal nodule, indeterminate. The right adrenal gland is unremarkable. The kidneys, and the visualized ureters appear unremarkable. The urinary bladder is collapsed. Stomach/Bowel: There is sigmoid diverticulosis without active inflammatory changes. There is a small hiatal hernia. A 2 cm duodenal diverticulum is also noted. There is no bowel obstruction or active inflammation. Appendectomy. Vascular/Lymphatic: There is moderate aortoiliac atherosclerotic disease. No portal venous gas. There is no adenopathy. Reproductive: The uterus and ovaries are grossly unremarkable. Other: None Musculoskeletal: There is multilevel degenerative changes of the spine. There is a transitional vertebra. There is grade 1 L5-S1 anterolisthesis (anterolisthesis of the disc space superior to the transitional vertebra). No acute osseous pathology. IMPRESSION: 1. Complex mass in the distal body/tail of the pancreas most consistent with pancreatic malignancy. There is a satellite lesion/metastasis adjacent to the tail of the pancreas. Further evaluation with pancreatic protocol MRI on a nonemergent basis recommended. 2. Hepatic metastatic disease. 3. Indeterminate left adrenal nodule. 4. Sigmoid diverticulosis. No bowel obstruction or active inflammation. Electronically Signed   By: Anner Crete M.D.   On: 10/27/2018 21:01        Scheduled Meds: . cephALEXin  500 mg Oral QID  . diltiazem  180 mg Oral Daily  . enoxaparin (LOVENOX) injection  40 mg Subcutaneous Daily  . gabapentin  300 mg Oral QHS  . levothyroxine  25 mcg Oral QAC breakfast  . metoprolol tartrate  50 mg Oral BID  . pantoprazole  40 mg Oral Daily  . raloxifene  60 mg Oral QHS  . ramipril  10 mg Oral BID  . cyanocobalamin  1,000 mcg Oral QHS   Continuous  Infusions:   LOS: 1 day    Time spent: [place], MD [facility]  If 7PM-7AM, please contact night-coverage [email or web address] Password [facility] 10/28/2018, 11:42 AM

## 2018-10-28 NOTE — ED Notes (Signed)
HANNAH RN AWARE OF ELEVATED TEMP. PROVIDER AWARE AND NO ACTION TAKEN.

## 2018-10-29 LAB — CBC WITH DIFFERENTIAL/PLATELET
Abs Immature Granulocytes: 0.42 10*3/uL — ABNORMAL HIGH (ref 0.00–0.07)
Basophils Absolute: 0.1 10*3/uL (ref 0.0–0.1)
Basophils Relative: 0 %
Eosinophils Absolute: 0.2 10*3/uL (ref 0.0–0.5)
Eosinophils Relative: 1 %
HCT: 31.4 % — ABNORMAL LOW (ref 36.0–46.0)
Hemoglobin: 9.4 g/dL — ABNORMAL LOW (ref 12.0–15.0)
Immature Granulocytes: 2 %
Lymphocytes Relative: 9 %
Lymphs Abs: 2.4 10*3/uL (ref 0.7–4.0)
MCH: 27.6 pg (ref 26.0–34.0)
MCHC: 29.9 g/dL — ABNORMAL LOW (ref 30.0–36.0)
MCV: 92.4 fL (ref 80.0–100.0)
Monocytes Absolute: 1.9 10*3/uL — ABNORMAL HIGH (ref 0.1–1.0)
Monocytes Relative: 8 %
Neutro Abs: 20.3 10*3/uL — ABNORMAL HIGH (ref 1.7–7.7)
Neutrophils Relative %: 80 %
RBC: 3.4 MIL/uL — ABNORMAL LOW (ref 3.87–5.11)
RDW: 13.6 % (ref 11.5–15.5)
WBC: 25.3 K/uL — ABNORMAL HIGH (ref 4.0–10.5)
nRBC: 0 % (ref 0.0–0.2)

## 2018-10-29 LAB — COMPREHENSIVE METABOLIC PANEL
ALT: 20 U/L (ref 0–44)
AST: 20 U/L (ref 15–41)
Albumin: 2.5 g/dL — ABNORMAL LOW (ref 3.5–5.0)
Alkaline Phosphatase: 129 U/L — ABNORMAL HIGH (ref 38–126)
Anion gap: 8 (ref 5–15)
BUN: 7 mg/dL — ABNORMAL LOW (ref 8–23)
CO2: 27 mmol/L (ref 22–32)
Calcium: 8.5 mg/dL — ABNORMAL LOW (ref 8.9–10.3)
Chloride: 102 mmol/L (ref 98–111)
Creatinine, Ser: 0.67 mg/dL (ref 0.44–1.00)
GFR calc non Af Amer: >60 mL/min (ref >60)
Glucose, Bld: 134 mg/dL — ABNORMAL HIGH (ref 70–99)
Sodium: 137 mmol/L (ref 135–145)
Total Bilirubin: 0.3 mg/dL (ref 0.3–1.2)
Total Protein: 6 g/dL — ABNORMAL LOW (ref 6.5–8.1)

## 2018-10-29 LAB — COMPREHENSIVE METABOLIC PANEL WITH GFR
GFR calc Af Amer: >60 mL/min (ref >60)
Potassium: 4.1 mmol/L (ref 3.5–5.1)

## 2018-10-29 LAB — MAGNESIUM: Magnesium: 2.2 mg/dL (ref 1.7–2.4)

## 2018-10-29 LAB — PROCALCITONIN: Procalcitonin: 0.37 ng/mL

## 2018-10-29 NOTE — Progress Notes (Signed)
PROGRESS NOTE    Ashlee Weeks  GHW:[id number] DOB: 06/19/1935 DOA: 10/27/2018 PCP: Hoyt Koch, MD  Brief Narrative:  84-year-old with past medical history relevant for hypertension, hypothyroidism who presents with 1 to 2 weeks of fevers and very elevated white blood cell count and found to have likely pancreatic cancer of the tail with metastases to the liver.   Assessment & Plan:   Principal Problem:   Sepsis ([place]) Active Problems:   Essential hypertension   Hypothyroidism   Pyuria   Pancreatic cancer metastasized to liver [facility]) suspected on CT   Fever   #) Fever/elevated white blood cell count: Likely secondary to active metastatic cancer/paraneoplastic syndrome.  Procalcitonin mildly elevated but no clear localizing signs or symptoms.  CTA of the chest shows again possibly metastatic disease but no evidence of pulmonary embolism.  Lower extremity ultrasound shows no evidence of DVT. -Blood cultures on 10/27/2018 no growth to date -Urine cultures on 10/27/2018 no growth to date  #) Likely metastatic pancreatic cancer: CT scan shows evidence of tumor at the tail of the pancreas with metastases to the liver.  CT scan of the chest shows spiculated lesions concerning with mets to the lungs.-Plan for interventional radiology to perform IR guided biopsy likely as an outpatient on Wednesday, 11/02/2018 -Culture will follow-up as an outpatient  #) hypertension: -Continue diltiazem 180 mg daily -Continue metoprolol tartrate 50 mg twice daily -Continue ramipril 10 mg twice daily  #) Hypothyroidism: -Continue levothyroxine 25 mcg daily  #) Bladder spasms: -Continue mirabegron 50 mg daily  #) Pain/psych: -Continue gabapentin 3 mg nightly  Fluids: Tolerating p.o. Electrolytes: Monitor and supplement Nutrition: Heart healthy diet  Prophylaxis: Enoxaparin  Disposition: Pending ruling out serious bacterial infection  Full code   Consultants:   Interventional  radiology  Oncology  Procedures:   None  Antimicrobials:   Oral cephalexin   Subjective: This morning patient reports she is doing fairly well.  She again had a fever last night and white blood cell count is quite elevated today but she denies any nausea, vomiting, diarrhea, cough, congestion, rhinorrhea.  Objective: Vitals:   10/28/18 1508 10/28/18 2139 10/29/18 0522 10/29/18 0924  BP: (!) 127/58 124/74 (!) 110/52 120/62  Pulse: 78 87 87 84  Resp: 20 16 15 15   Temp: 99.7 F (37.6 C) 98.8 F (37.1 C) 99.9 F (37.7 C) 98.8 F (37.1 C)  TempSrc: Oral Oral Oral Oral  SpO2:  90% 90% 94%  Weight:      Height:        Intake/Output Summary (Last 24 hours) at 10/29/2018 1111 Last data filed at 10/29/2018 0522 Gross per 24 hour  Intake 520 ml  Output -  Net 520 ml   Filed Weights   10/27/18 1758  Weight: 76.2 kg    Examination:  General exam: Appears calm and comfortable  Respiratory system: Clear to auscultation. Respiratory effort normal. Cardiovascular system: Regular rate and rhythm, no murmurs Gastrointestinal system: Abdomen is nondistended, soft and nontender. No organomegaly or masses felt. Normal bowel sounds heard. Central nervous system: Alert and oriented.  Is intact, moving all extremities Extremities: 1+ lower extremity edema. Skin: No rashes visible skin Psychiatry: Judgement and insight appear normal. Mood & affect appropriate.     Data Reviewed: I have personally reviewed following labs and imaging studies  CBC: Recent Labs  Lab 10/27/18 1628 10/27/18 1823 10/27/18 1922 10/28/18 0113 10/29/18 0410  WBC 27.2 Repeated and verified X2.* 25.5*  --  23.4* 25.3*  NEUTROABS  --   --  22.7*  --  20.3*  HGB 10.7* 10.3*  --  9.9* 9.4*  HCT 33.8* 34.1*  --  32.5* 31.4*  MCV 85.6 91.7  --  91.5 92.4  PLT 482.0* 428*  --  412* PLATELET CLUMPS NOTED ON SMEAR, COUNT APPEARS INCREASED   Basic Metabolic Panel: Recent Labs  Lab 10/27/18 1823  10/28/18 0113 10/28/18 1145 10/29/18 0410  NA 136  --   --  137  K 3.8  --   --  4.1  CL 99  --   --  102  CO2 27  --   --  27  GLUCOSE 166*  --   --  134*  BUN 10  --   --  7*  CREATININE 0.79 0.83  --  0.67  CALCIUM 8.5*  --   --  8.5*  MG  --   --   --  2.2  PHOS  --   --  3.2  --    GFR: Estimated Creatinine Clearance: 53.9 mL/min (by C-G formula based on SCr of 0.67 mg/dL). Liver Function Tests: Recent Labs  Lab 10/27/18 1823 10/29/18 0410  AST 28 20  ALT 25 20  ALKPHOS 135* 129*  BILITOT 0.4 0.3  PROT 6.8 6.0*  ALBUMIN 2.9* 2.5*   Recent Labs  Lab 10/27/18 1823  LIPASE 37   No results for input(s): AMMONIA in the last 168 hours. Coagulation Profile: No results for input(s): INR, PROTIME in the last 168 hours. Cardiac Enzymes: Recent Labs  Lab 10/27/18 1922  TROPONINI <0.03   BNP (last 3 results) No results for input(s): PROBNP in the last 8760 hours. HbA1C: No results for input(s): HGBA1C in the last 72 hours. CBG: No results for input(s): GLUCAP in the last 168 hours. Lipid Profile: No results for input(s): CHOL, HDL, LDLCALC, TRIG, CHOLHDL, LDLDIRECT in the last 72 hours. Thyroid Function Tests: No results for input(s): TSH, T4TOTAL, FREET4, T3FREE, THYROIDAB in the last 72 hours. Anemia Panel: No results for input(s): VITAMINB12, FOLATE, FERRITIN, TIBC, IRON, RETICCTPCT in the last 72 hours. Sepsis Labs: Recent Labs  Lab 10/27/18 2000 10/28/18 1145 10/29/18 0410  PROCALCITON  --  0.24 0.37  LATICACIDVEN 1.5  --   --     Recent Results (from the past 240 hour(s))  Blood culture (routine x 2)     Status: None   Collection Time: 10/21/18  6:26 PM  Result Value Ref Range Status   Specimen Description   Final    BLOOD RIGHT HAND Performed at [facility] [address] [place], [place] [address]    Special Requests   Final    BOTTLES DRAWN AEROBIC AND ANAEROBIC Blood Culture adequate volume Performed at [facility] [address], [place], [place] [address]    Culture   Final    NO GROWTH 5 DAYS Performed at [facility], [place] [address], [place], [place] [address]    Report Status 10/26/2018 FINAL  Final  Blood culture (routine x 2)     Status: Abnormal   Collection Time: 10/21/18  6:37 PM  Result Value Ref Range Status   Specimen Description   Final    BLOOD LEFT HAND Performed at [facility] [address], [place], [place] [address]    Special Requests   Final    BOTTLES DRAWN AEROBIC ONLY Blood Culture results may not be optimal due to an  inadequate volume of blood received in culture bottles Performed at [facility] [address] [place], [place] [address]    Culture  Setup Time   Final    GRAM POSITIVE COCCI IN CLUSTERS AEROBIC BOTTLE ONLY CRITICAL RESULT CALLED TO, READ BACK BY AND VERIFIED WITH: S. GOUGE, RN ([place]) AT [place] ON 10/22/18 BY C. JESSUP, MLT.    Culture (A)  Final    STAPHYLOCOCCUS SPECIES (COAGULASE NEGATIVE) THE SIGNIFICANCE OF ISOLATING THIS ORGANISM FROM A SINGLE SET OF BLOOD CULTURES WHEN MULTIPLE SETS ARE DRAWN IS UNCERTAIN. PLEASE NOTIFY THE MICROBIOLOGY DEPARTMENT WITHIN ONE WEEK IF SPECIATION AND SENSITIVITIES ARE REQUIRED. Performed at [facility], [place] [address], [place], [place] [address]    Report Status 10/24/2018 FINAL  Final  Blood Culture ID Panel (Reflexed)     Status: Abnormal   Collection Time: 10/21/18  6:37 PM  Result Value Ref Range Status   Enterococcus species NOT DETECTED NOT DETECTED Final   Listeria monocytogenes NOT DETECTED NOT DETECTED Final   Staphylococcus species DETECTED (A) NOT DETECTED Final    Comment: Methicillin (oxacillin) susceptible coagulase negative staphylococcus. Possible blood culture contaminant (unless isolated from more than one blood culture draw or clinical case suggests pathogenicity). No antibiotic treatment is indicated for blood  culture  contaminants. CRITICAL RESULT CALLED TO, READ BACK BY AND VERIFIED WITH: S. GOUGE, RN ([facility]) AT 2956 ON 10/22/18 BY C. JESSUP, MLT.    Staphylococcus aureus (BCID) NOT DETECTED NOT DETECTED Final   Methicillin resistance NOT DETECTED NOT DETECTED Final   Streptococcus species NOT DETECTED NOT DETECTED Final   Streptococcus agalactiae NOT DETECTED NOT DETECTED Final   Streptococcus pneumoniae NOT DETECTED NOT DETECTED Final   Streptococcus pyogenes NOT DETECTED NOT DETECTED Final   Acinetobacter baumannii NOT DETECTED NOT DETECTED Final   Enterobacteriaceae species NOT DETECTED NOT DETECTED Final   Enterobacter cloacae complex NOT DETECTED NOT DETECTED Final   Escherichia coli NOT DETECTED NOT DETECTED Final   Klebsiella oxytoca NOT DETECTED NOT DETECTED Final   Klebsiella pneumoniae NOT DETECTED NOT DETECTED Final   Proteus species NOT DETECTED NOT DETECTED Final   Serratia marcescens NOT DETECTED NOT DETECTED Final   Haemophilus influenzae NOT DETECTED NOT DETECTED Final   Neisseria meningitidis NOT DETECTED NOT DETECTED Final   Pseudomonas aeruginosa NOT DETECTED NOT DETECTED Final   Candida albicans NOT DETECTED NOT DETECTED Final   Candida glabrata NOT DETECTED NOT DETECTED Final   Candida krusei NOT DETECTED NOT DETECTED Final   Candida parapsilosis NOT DETECTED NOT DETECTED Final   Candida tropicalis NOT DETECTED NOT DETECTED Final    Comment: Performed at [facility], [place] [address], [place], [place] [address]  Urine culture     Status: None   Collection Time: 10/21/18  7:59 PM  Result Value Ref Range Status   Specimen Description   Final    URINE, RANDOM Performed at [facility] [address], [place], [place] [address]    Special Requests   Final    NONE Performed at [facility], [place] [address] [place], [place] [address]    Culture   Final    NO GROWTH Performed at [facility], [place] [address] [place],  [place] [address]    Report Status 10/23/2018 FINAL  Final  Urine culture     Status: Abnormal (Preliminary result)   Collection Time: 10/27/18  7:22 PM  Result Value Ref Range Status  Specimen Description URINE, RANDOM  Final   Special Requests   Final    NONE Performed at [facility] [address], [place], [place] [address]    Culture 10,000 COLONIES/mL GRAM NEGATIVE RODS (A)  Final   Report Status PENDING  Incomplete  Culture, blood (routine x 2)     Status: None (Preliminary result)   Collection Time: 10/27/18  7:22 PM  Result Value Ref Range Status   Specimen Description   Final    BLOOD RIGHT ANTECUBITAL Performed at [facility] [address] [place], [place] [address]    Special Requests   Final    BOTTLES DRAWN AEROBIC AND ANAEROBIC Blood Culture adequate volume Performed at [facility] [address], [place], [place] [address]    Culture   Final    NO GROWTH 1 DAY Performed at [facility], [place] [address] [place], [place] [address]    Report Status PENDING  Incomplete  Culture, blood (routine x 2)     Status: None (Preliminary result)   Collection Time: 10/27/18  7:27 PM  Result Value Ref Range Status   Specimen Description   Final    BLOOD LEFT HAND Performed at [facility] [address], [place], [place] [address]    Special Requests   Final    BOTTLES DRAWN AEROBIC ONLY Blood Culture adequate volume Performed at [facility] [address], [place], [place] [address]    Culture   Final    NO GROWTH 1 DAY Performed at [facility], [place] [address], [place], [place] [address]    Report Status PENDING  Incomplete         Radiology Studies: Dg Chest 2 View  Result Date: 10/27/2018 CLINICAL DATA:  Leukocytosis EXAM: CHEST - 2 VIEW COMPARISON:  10/21/2018 FINDINGS: Streaky atelectasis or scarring at the bases. No pleural effusion. Borderline  cardiomegaly. No pneumothorax. Left shoulder replacement. IMPRESSION: Streaky bibasilar atelectasis or scarring Electronically Signed   By: Donavan Foil M.D.   On: 10/27/2018 20:59   Ct Angio Chest Pe W Or Wo Contrast  Result Date: 10/28/2018 CLINICAL DATA:  New diagnosis of pancreatic cancer with metastatic disease to the liver yesterday. EXAM: CT ANGIOGRAPHY CHEST WITH CONTRAST TECHNIQUE: Multidetector CT imaging of the chest was performed using the standard protocol during bolus administration of intravenous contrast. Multiplanar CT image reconstructions and MIPs were obtained to evaluate the vascular anatomy. CONTRAST:  123mL ISOVUE-370 IOPAMIDOL (ISOVUE-370) INJECTION 76% COMPARISON:  Chest radiography 10/27/2018 FINDINGS: Cardiovascular: Pulmonary arterial opacification is good. No visible pulmonary emboli. The study shows some motion degradation. There is aortic atherosclerosis without aneurysm or dissection. There is coronary artery calcification. Heart size is normal. Mediastinum/Nodes: No mediastinal or hilar mass or lymphadenopathy. Lungs/Pleura: There is mild bilateral lower lobe atelectasis. There is no pleural effusion. There is an 8 mm slightly spiculated nodule in the left upper lobe image 38. There is an 11 mm slightly spiculated nodule in the superior segment right lower lobe image 52. In this setting these are worrisome for pulmonary metastases. Upper Abdomen: See results of scan yesterday. Musculoskeletal: No lytic lesions seen. Ordinary spinal degenerative change. Review of the MIP images confirms the above findings. IMPRESSION: 1. No pulmonary emboli. 2. 8 mm slightly spiculated nodule in the left upper lobe. 11 mm slightly spiculated nodule in the superior segment right lower lobe. These are worrisome for pulmonary metastases. 3. Coronary artery calcification.  Aortic Atherosclerosis (ICD10-I70.0). Electronically Signed   By: Nelson Chimes M.D.   On: 10/28/2018 19:01   Ct Abdomen Pelvis W  Contrast  Result Date: 10/27/2018 CLINICAL DATA:  84 year old female with fever of unknown origin. Recent diagnosis of cystitis. EXAM: CT ABDOMEN AND PELVIS WITH CONTRAST TECHNIQUE: Multidetector CT imaging of the abdomen and pelvis was performed using the standard protocol following bolus administration of intravenous contrast. CONTRAST:  174mL ISOVUE-300 IOPAMIDOL (ISOVUE-300) INJECTION 61% COMPARISON:  None. FINDINGS: Lower chest: There are bibasilar linear atelectasis/scarring. There is mild cardiomegaly. Multi vessel coronary vascular calcification. No intra-abdominal free air or free fluid. Hepatobiliary: Multiple (greater than 20) hypodense lesions throughout the liver measure up to 2.5 x 3.0 cm in the right lobe of the liver most consistent with metastatic disease. There is no intrahepatic biliary ductal dilatation. Cholecystectomy. Pancreas: There is a 3.2 x 3.9 x 4.2 cm complex hypodense/hypoenhancing mass in the distal body/tail of the pancreas most consistent with malignancy. This likely represents a primary neoplasm of the pancreas such as adenoma carcinoma versus less likely metastatic disease to the pancreas. There is atrophy of the pancreatic tail. There is a 2.0 x 2.0 cm rounded density adjacent to the tail of the pancreas in the region of the splenic hilum consistent with a satellite lesion/metastasis. Spleen: Normal in size without focal abnormality. Adrenals/Urinary Tract: There is a 9 mm left adrenal nodule, indeterminate. The right adrenal gland is unremarkable. The kidneys, and the visualized ureters appear unremarkable. The urinary bladder is collapsed. Stomach/Bowel: There is sigmoid diverticulosis without active inflammatory changes. There is a small hiatal hernia. A 2 cm duodenal diverticulum is also noted. There is no bowel obstruction or active inflammation. Appendectomy. Vascular/Lymphatic: There is moderate aortoiliac atherosclerotic disease. No portal venous gas. There is no  adenopathy. Reproductive: The uterus and ovaries are grossly unremarkable. Other: None Musculoskeletal: There is multilevel degenerative changes of the spine. There is a transitional vertebra. There is grade 1 L5-S1 anterolisthesis (anterolisthesis of the disc space superior to the transitional vertebra). No acute osseous pathology. IMPRESSION: 1. Complex mass in the distal body/tail of the pancreas most consistent with pancreatic malignancy. There is a satellite lesion/metastasis adjacent to the tail of the pancreas. Further evaluation with pancreatic protocol MRI on a nonemergent basis recommended. 2. Hepatic metastatic disease. 3. Indeterminate left adrenal nodule. 4. Sigmoid diverticulosis. No bowel obstruction or active inflammation. Electronically Signed   By: Anner Crete M.D.   On: 10/27/2018 21:01   Vas [place] Lower Extremity Venous (dvt)  Result Date: 10/28/2018  Lower Venous Study Indications: Edema. Other Indications: Suspected Pancreatic cancer with metastasis. Limitations: Body habitus. Comparison Study: No comparison study available. Performing Technologist: Rudell Cobb  Examination Guidelines: A complete evaluation includes B-mode imaging, spectral Doppler, color Doppler, and power Doppler as needed of all accessible portions of each vessel. Bilateral testing is considered an integral part of a complete examination. Limited examinations for reoccurring indications may be performed as noted.  Right Venous Findings: +---------+---------------+---------+-----------+----------+-------+          CompressibilityPhasicitySpontaneityPropertiesSummary +---------+---------------+---------+-----------+----------+-------+ CFV      Full           Yes      Yes                          +---------+---------------+---------+-----------+----------+-------+ SFJ      Full                                                 +---------+---------------+---------+-----------+----------+-------+  FV Prox   Full                                                 +---------+---------------+---------+-----------+----------+-------+ FV Mid   Full                                                 +---------+---------------+---------+-----------+----------+-------+ FV DistalFull                                                 +---------+---------------+---------+-----------+----------+-------+ PFV      Full                                                 +---------+---------------+---------+-----------+----------+-------+ POP      Full           Yes      Yes                          +---------+---------------+---------+-----------+----------+-------+ PTV      Full                                                 +---------+---------------+---------+-----------+----------+-------+ PERO     Full                                                 +---------+---------------+---------+-----------+----------+-------+  Left Venous Findings: +---------+---------------+---------+-----------+----------+-------+          CompressibilityPhasicitySpontaneityPropertiesSummary +---------+---------------+---------+-----------+----------+-------+ CFV      Full           Yes      Yes                          +---------+---------------+---------+-----------+----------+-------+ SFJ      Full                                                 +---------+---------------+---------+-----------+----------+-------+ FV Prox  Full                                                 +---------+---------------+---------+-----------+----------+-------+ FV Mid   Full                                                 +---------+---------------+---------+-----------+----------+-------+  FV DistalFull                                                 +---------+---------------+---------+-----------+----------+-------+ PFV      Full                                                  +---------+---------------+---------+-----------+----------+-------+ POP      Full           Yes      Yes                          +---------+---------------+---------+-----------+----------+-------+ PTV      Full                                                 +---------+---------------+---------+-----------+----------+-------+ PERO     Full                                                 +---------+---------------+---------+-----------+----------+-------+    Summary: Right: There is no evidence of deep vein thrombosis in the lower extremity. No cystic structure found in the popliteal fossa. Left: There is no evidence of deep vein thrombosis in the lower extremity. No cystic structure found in the popliteal fossa.  *See table(s) above for measurements and observations.    Preliminary         Scheduled Meds: . cephALEXin  500 mg Oral QID  . diltiazem  180 mg Oral Daily  . enoxaparin (LOVENOX) injection  40 mg Subcutaneous Daily  . gabapentin  300 mg Oral QHS  . levothyroxine  25 mcg Oral QAC breakfast  . metoprolol tartrate  50 mg Oral BID  . pantoprazole  40 mg Oral Daily  . raloxifene  60 mg Oral QHS  . ramipril  10 mg Oral BID  . cyanocobalamin  1,000 mcg Oral QHS   Continuous Infusions:   LOS: 2 days    Time spent: [place], MD [facility]  If 7PM-7AM, please contact night-coverage [email or web address] Password [email or web address] 10/29/2018, 11:11 AM

## 2018-10-30 ENCOUNTER — Inpatient Hospital Stay (HOSPITAL_COMMUNITY): Payer: Medicare Other

## 2018-10-30 LAB — CBC
HCT: 30.7 % — ABNORMAL LOW (ref 36.0–46.0)
Hemoglobin: 9.2 g/dL — ABNORMAL LOW (ref 12.0–15.0)
MCH: 27.1 pg (ref 26.0–34.0)
MCHC: 30 g/dL (ref 30.0–36.0)
MCV: 90.3 fL (ref 80.0–100.0)
Platelets: 417 10*3/uL — ABNORMAL HIGH (ref 150–400)
RBC: 3.4 MIL/uL — ABNORMAL LOW (ref 3.87–5.11)
RDW: 13.6 % (ref 11.5–15.5)
WBC: 23.6 10*3/uL — ABNORMAL HIGH (ref 4.0–10.5)
nRBC: 0.1 % (ref 0.0–0.2)

## 2018-10-30 LAB — URINE CULTURE: Culture: 10000 — AB

## 2018-10-30 LAB — PROCALCITONIN: Procalcitonin: 0.35 ng/mL

## 2018-10-30 LAB — BRAIN NATRIURETIC PEPTIDE: B Natriuretic Peptide: 69.5 pg/mL (ref 0.0–100.0)

## 2018-10-30 MED ORDER — ACETAMINOPHEN 325 MG PO TABS
325.0000 mg | ORAL_TABLET | Freq: Once | ORAL | Status: AC
  Administered 2018-10-30: 325 mg via ORAL
  Filled 2018-10-30: qty 1

## 2018-10-30 NOTE — Plan of Care (Signed)
Patient in bed this morning; MEWS was red earlier this morning so continuing with MEWS protocol. Patient stable with no complaints at this time. Will continue to monitor.

## 2018-10-30 NOTE — Progress Notes (Addendum)
   10/30/18 0616  Vitals  BP (!) 139/55  BP Location Left Arm  BP Method Automatic  Patient Position (if appropriate) Lying  Pulse Rate 95  Resp (!) 36  Oxygen Therapy  SpO2 94 %  O2 Device Nasal Cannula  O2 Flow Rate (L/min) 2 L/min  Pain Assessment  Pain Scale 0-10  Pain Score 3  Pain Type Chronic pain  Pain Location Shoulder  Pain Orientation Right  Pain Descriptors / Indicators Dull;Discomfort  Pain Frequency Intermittent  Pain Onset Gradual  Patients Stated Pain Goal 2  Pain Intervention(s) Medication (See eMAR)  MEWS Score  MEWS RR 3  MEWS Pulse 0  MEWS Systolic 0  MEWS LOC 0  MEWS Temp 2  MEWS Score 5  MEWS Score Color Red  MEWS Assessment  Is this an acute change? Yes  MEWS guidelines implemented *See [place]  Provider Notification  Provider Name/Title bodenheimer  Date Provider Notified 10/30/18  Time Provider Notified [phone]  Notification Type Page  Notification Reason Change in status (red)  Response See new orders  Time of Provider Response 0626  Rapid Response Notification  Name of Rapid Response RN Notified Lorretta Harp, RN  Date Rapid Response Notified 10/30/18  Time Rapid Response Notified 0630     Vital Signs MEWS/VS Documentation      10/29/2018 2128 10/29/2018 2157 10/30/2018 0609 10/30/2018 0616   MEWS Score:  0  -  3  5   MEWS Score Color:  Green  -  Yellow  Red   Resp:  20  -  (!) 22  (!) 36   Pulse:  88  -  96  95   BP:  (!) 132/54  -  (!) 122/56  (!) 139/55   Temp:  99.9 F (37.7 C)  -  (!) 102.8 F (39.3 C)  -   O2 Device:  Room Air  Room Air  Room Air  Nasal Cannula   O2 Flow Rate (L/min):  -  -  -  2 L/min           Darnelle Catalan 10/30/2018,6:36 AM

## 2018-10-30 NOTE — Progress Notes (Signed)
PROGRESS NOTE    Ashlee Weeks  SAY:[id number] DOB: May 28, 1935 DOA: 10/27/2018 PCP: Hoyt Koch, MD  Brief Narrative:  84-year-old with past medical history relevant for hypertension, hypothyroidism who presents with 1 to 2 weeks of fevers and very elevated white blood cell count and found to have likely pancreatic cancer of the tail with metastases to the liver.   Assessment & Plan:   Principal Problem:   Sepsis ([place]) Active Problems:   Essential hypertension   Hypothyroidism   Pyuria   Pancreatic cancer metastasized to liver [facility]) suspected on CT   Fever  #) Acute hypoxia: Patient last night was noted to be hypoxic.  Stat chest x-ray showed no evidence of pneumonia or edema but did show some atelectasis.  Unfortunately while ambulating patient had desaturation. 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  Patient has had cultures that have been negative and is been off antibiotics.  White blood cell count is still persistently elevated.  She has no localizing signs or symptoms and extensive imaging of the CT of the chest and abdomen shows no evidence of infectious process but only metastatic cancer.  Discussed briefly with infectious disease and felt that this was the most likely cause and the patient did not need any further evaluation. -Blood cultures on 10/27/2018 no growth to date -Urine cultures on 10/27/2018 no growth to date  #) Likely metastatic pancreatic cancer: CT scan shows evidence of tumor at the tail of the pancreas with metastases to the liver.  CT scan of the chest shows spiculated lesions concerning with mets to the lungs. -Plan for interventional radiology to perform IR guided biopsy likely as an outpatient on Wednesday, 11/02/2018 -Culture will follow-up as an outpatient  #) hypertension: -Continue diltiazem 180 mg daily -Continue metoprolol  tartrate 50 mg twice daily -Continue ramipril 10 mg twice daily  #) Hypothyroidism: -Continue levothyroxine 25 mcg daily  #) Bladder spasms: -Continue mirabegron 50 mg daily  #) Pain/psych: -Continue gabapentin 3 mg nightly  Fluids: Tolerating p.o. Electrolytes: Monitor and supplement Nutrition: Heart healthy diet  Prophylaxis: Enoxaparin  Disposition: Pending ruling out serious bacterial infection  Full code   Consultants:   Interventional radiology  Oncology  Procedures:   None  Antimicrobials:   Oral cephalexin   Subjective: This morning patient reports she is doing fairly well.  Overnight patient had desaturation requiring oxygen.  She denies being short of breath and denies any cough, nausea, vomiting, diarrhea, chest pain.  Objective: Vitals:   10/30/18 0727 10/30/18 0748 10/30/18 0820 10/30/18 0918  BP: (!) 123/56 (!) 127/52 136/61 120/61  Pulse: 86 80 79 89  Resp: 20 20 20 20   Temp: 99.6 F (37.6 C) 98.1 F (36.7 C) 98 F (36.7 C) 98.3 F (36.8 C)  TempSrc: Oral Oral Oral Oral  SpO2: 94% 94% 93% (!) 85%  Weight:      Height:        Intake/Output Summary (Last 24 hours) at 10/30/2018 1034 Last data filed at 10/30/2018 0610 Gross per 24 hour  Intake 0 ml  Output 0 ml  Net 0 ml   Filed Weights   10/27/18 1758  Weight: 76.2 kg    Examination:  General exam: Appears calm and comfortable  Respiratory system: Clear to auscultation. Respiratory effort normal. Cardiovascular system: Regular rate and rhythm, no murmurs Gastrointestinal system: Abdomen is nondistended, soft and nontender. No organomegaly  or masses felt. Normal bowel sounds heard. Central nervous system: Alert and oriented.  Is intact, moving all extremities Extremities: 1+ lower extremity edema. Skin: No rashes visible skin Psychiatry: Judgement and insight appear normal. Mood & affect appropriate.     Data Reviewed: I have personally reviewed following labs and imaging  studies  CBC: Recent Labs  Lab 10/27/18 1628 10/27/18 1823 10/27/18 1922 10/28/18 0113 10/29/18 0410 10/30/18 0407  WBC 27.2 Repeated and verified X2.* 25.5*  --  23.4* 25.3* 23.6*  NEUTROABS  --   --  22.7*  --  20.3*  --   HGB 10.7* 10.3*  --  9.9* 9.4* 9.2*  HCT 33.8* 34.1*  --  32.5* 31.4* 30.7*  MCV 85.6 91.7  --  91.5 92.4 90.3  PLT 482.0* 428*  --  412* PLATELET CLUMPS NOTED ON SMEAR, COUNT APPEARS INCREASED 960*   Basic Metabolic Panel: Recent Labs  Lab 10/27/18 1823 10/28/18 0113 10/28/18 1145 10/29/18 0410  NA 136  --   --  137  K 3.8  --   --  4.1  CL 99  --   --  102  CO2 27  --   --  27  GLUCOSE 166*  --   --  134*  BUN 10  --   --  7*  CREATININE 0.79 0.83  --  0.67  CALCIUM 8.5*  --   --  8.5*  MG  --   --   --  2.2  PHOS  --   --  3.2  --    GFR: Estimated Creatinine Clearance: 53.9 mL/min (by C-G formula based on SCr of 0.67 mg/dL). Liver Function Tests: Recent Labs  Lab 10/27/18 1823 10/29/18 0410  AST 28 20  ALT 25 20  ALKPHOS 135* 129*  BILITOT 0.4 0.3  PROT 6.8 6.0*  ALBUMIN 2.9* 2.5*   Recent Labs  Lab 10/27/18 1823  LIPASE 37   No results for input(s): AMMONIA in the last 168 hours. Coagulation Profile: No results for input(s): INR, PROTIME in the last 168 hours. Cardiac Enzymes: Recent Labs  Lab 10/27/18 1922  TROPONINI <0.03   BNP (last 3 results) No results for input(s): PROBNP in the last 8760 hours. HbA1C: No results for input(s): HGBA1C in the last 72 hours. CBG: No results for input(s): GLUCAP in the last 168 hours. Lipid Profile: No results for input(s): CHOL, HDL, LDLCALC, TRIG, CHOLHDL, LDLDIRECT in the last 72 hours. Thyroid Function Tests: No results for input(s): TSH, T4TOTAL, FREET4, T3FREE, THYROIDAB in the last 72 hours. Anemia Panel: No results for input(s): VITAMINB12, FOLATE, FERRITIN, TIBC, IRON, RETICCTPCT in the last 72 hours. Sepsis Labs: Recent Labs  Lab 10/27/18 2000 10/28/18 1145  10/29/18 0410 10/30/18 0407  PROCALCITON  --  0.24 0.37 0.35  LATICACIDVEN 1.5  --   --   --     Recent Results (from the past 240 hour(s))  Blood culture (routine x 2)     Status: None   Collection Time: 10/21/18  6:26 PM  Result Value Ref [place] Status   Specimen Description   Final    BLOOD RIGHT HAND Performed at [facility] [address], [place], [place] [address]    Special Requests   Final    BOTTLES DRAWN AEROBIC AND ANAEROBIC Blood Culture adequate volume Performed at [facility] [address], [place], [place] [address]    Culture   Final    NO GROWTH 5  DAYS Performed at [facility], [place] [address] [place], [place] [address]    Report Status 10/26/2018 FINAL  Final  Blood culture (routine x 2)     Status: Abnormal   Collection Time: 10/21/18  6:37 PM  Result Value Ref [place] Status   Specimen Description   Final    BLOOD LEFT HAND Performed at [facility] [address], [place], [place] [address]    Special Requests   Final    BOTTLES DRAWN AEROBIC ONLY Blood Culture results may not be optimal due to an inadequate volume of blood received in culture bottles Performed at [facility] [address], [place], [place] [address]    Culture  Setup Time   Final    GRAM POSITIVE COCCI IN CLUSTERS AEROBIC BOTTLE ONLY CRITICAL RESULT CALLED TO, READ BACK BY AND VERIFIED WITH: S. GOUGE, RN ([place]) AT [place] ON 10/22/18 BY C. JESSUP, MLT.    Culture (A)  Final    STAPHYLOCOCCUS SPECIES (COAGULASE NEGATIVE) THE SIGNIFICANCE OF ISOLATING THIS ORGANISM FROM A SINGLE SET OF BLOOD CULTURES WHEN MULTIPLE SETS ARE DRAWN IS UNCERTAIN. PLEASE NOTIFY THE MICROBIOLOGY DEPARTMENT WITHIN ONE WEEK IF SPECIATION AND SENSITIVITIES ARE REQUIRED. Performed at [facility], [place] [address] [place], [place] [address]    Report Status 10/24/2018 FINAL  Final  Blood Culture ID Panel (Reflexed)      Status: Abnormal   Collection Time: 10/21/18  6:37 PM  Result Value Ref Range Status   Enterococcus species NOT DETECTED NOT DETECTED Final   Listeria monocytogenes NOT DETECTED NOT DETECTED Final   Staphylococcus species DETECTED (A) NOT DETECTED Final    Comment: Methicillin (oxacillin) susceptible coagulase negative staphylococcus. Possible blood culture contaminant (unless isolated from more than one blood culture draw or clinical case suggests pathogenicity). No antibiotic treatment is indicated for blood  culture contaminants. CRITICAL RESULT CALLED TO, READ BACK BY AND VERIFIED WITH: S. GOUGE, RN ([facility]) AT 9169 ON 10/22/18 BY C. JESSUP, MLT.    Staphylococcus aureus (BCID) NOT DETECTED NOT DETECTED Final   Methicillin resistance NOT DETECTED NOT DETECTED Final   Streptococcus species NOT DETECTED NOT DETECTED Final   Streptococcus agalactiae NOT DETECTED NOT DETECTED Final   Streptococcus pneumoniae NOT DETECTED NOT DETECTED Final   Streptococcus pyogenes NOT DETECTED NOT DETECTED Final   Acinetobacter baumannii NOT DETECTED NOT DETECTED Final   Enterobacteriaceae species NOT DETECTED NOT DETECTED Final   Enterobacter cloacae complex NOT DETECTED NOT DETECTED Final   Escherichia coli NOT DETECTED NOT DETECTED Final   Klebsiella oxytoca NOT DETECTED NOT DETECTED Final   Klebsiella pneumoniae NOT DETECTED NOT DETECTED Final   Proteus species NOT DETECTED NOT DETECTED Final   Serratia marcescens NOT DETECTED NOT DETECTED Final   Haemophilus influenzae NOT DETECTED NOT DETECTED Final   Neisseria meningitidis NOT DETECTED NOT DETECTED Final   Pseudomonas aeruginosa NOT DETECTED NOT DETECTED Final   Candida albicans NOT DETECTED NOT DETECTED Final   Candida glabrata NOT DETECTED NOT DETECTED Final   Candida krusei NOT DETECTED NOT DETECTED Final   Candida parapsilosis NOT DETECTED NOT DETECTED Final   Candida tropicalis NOT DETECTED NOT DETECTED Final    Comment: Performed at [facility], [place] [address] [place], [place] [address]  Urine culture     Status: None   Collection Time: 10/21/18  7:59 PM  Result Value Ref Range Status   Specimen Description   Final  URINE, RANDOM Performed at [facility], [place] [address] [place] [address] [place], [place] [address]    Special Requests   Final    NONE Performed at [facility], [place] [address], [place], [place] [address]    Culture   Final    NO GROWTH Performed at [facility], [place] [address], [place], [place] [address]    Report Status 10/23/2018 FINAL  Final  Urine culture     Status: Abnormal   Collection Time: 10/27/18  7:22 PM  Result Value Ref Range Status   Specimen Description URINE, RANDOM  Final   Special Requests   Final    NONE Performed at [facility], [place] [address], [place], [place] [address]    Culture 10,000 COLONIES/mL PSEUDOMONAS AERUGINOSA (A)  Final   Report Status 10/30/2018 FINAL  Final   Organism ID, Bacteria PSEUDOMONAS AERUGINOSA (A)  Final      Susceptibility   Pseudomonas aeruginosa - MIC*    CEFTAZIDIME 2 SENSITIVE Sensitive     CIPROFLOXACIN <=0.25 SENSITIVE Sensitive     GENTAMICIN <=1 SENSITIVE Sensitive     IMIPENEM 2 SENSITIVE Sensitive     PIP/TAZO <=4 SENSITIVE Sensitive     CEFEPIME <=1 SENSITIVE Sensitive     * 10,000 COLONIES/mL PSEUDOMONAS AERUGINOSA  Culture, blood (routine x 2)     Status: None (Preliminary result)   Collection Time: 10/27/18  7:22 PM  Result Value Ref Range Status   Specimen Description   Final    BLOOD RIGHT ANTECUBITAL Performed at [facility] [address], [place], [place] [address]    Special Requests   Final    BOTTLES DRAWN AEROBIC AND ANAEROBIC Blood Culture adequate volume Performed at [facility] [address] [place], [place] [address]    Culture   Final    NO GROWTH 1 DAY Performed at [facility], [place] [address],  [place], [place] [address]    Report Status PENDING  Incomplete  Culture, blood (routine x 2)     Status: None (Preliminary result)   Collection Time: 10/27/18  7:27 PM  Result Value Ref Range Status   Specimen Description   Final    BLOOD LEFT HAND Performed at [facility] [address], [place], [place] [address]    Special Requests   Final    BOTTLES DRAWN AEROBIC ONLY Blood Culture adequate volume Performed at [facility] [address], [place], [place] [address]    Culture   Final    NO GROWTH 1 DAY Performed at [facility], [place] [address] [place], [place] [address]    Report Status PENDING  Incomplete         Radiology Studies: Ct Angio Chest Pe W Or Wo Contrast  Result Date: 10/28/2018 CLINICAL DATA:  New diagnosis of pancreatic cancer with metastatic disease to the liver yesterday. EXAM: CT ANGIOGRAPHY CHEST WITH CONTRAST TECHNIQUE: Multidetector CT imaging of the chest was performed using the standard protocol during bolus administration of intravenous contrast. Multiplanar CT image reconstructions and MIPs were obtained to evaluate the vascular anatomy. CONTRAST:  17mL ISOVUE-370 IOPAMIDOL (ISOVUE-370) INJECTION 76% COMPARISON:  Chest radiography 10/27/2018 FINDINGS: Cardiovascular: Pulmonary arterial opacification is good. No visible pulmonary emboli. The study shows some motion degradation. There is aortic atherosclerosis without aneurysm or dissection. There is coronary artery calcification. Heart size is normal. Mediastinum/Nodes: No mediastinal or hilar mass or lymphadenopathy.  Lungs/Pleura: There is mild bilateral lower lobe atelectasis. There is no pleural effusion. There is an 8 mm slightly spiculated nodule in the left upper lobe image 38. There is an 11 mm slightly spiculated nodule in the superior segment right lower lobe image 52. In this setting these are worrisome for pulmonary metastases. Upper Abdomen: See results of  scan yesterday. Musculoskeletal: No lytic lesions seen. Ordinary spinal degenerative change. Review of the MIP images confirms the above findings. IMPRESSION: 1. No pulmonary emboli. 2. 8 mm slightly spiculated nodule in the left upper lobe. 11 mm slightly spiculated nodule in the superior segment right lower lobe. These are worrisome for pulmonary metastases. 3. Coronary artery calcification. Aortic Atherosclerosis (ICD10-I70.0). Electronically Signed   By: Nelson Chimes M.D.   On: 10/28/2018 19:01   Dg Chest Port 1 View  Result Date: 10/30/2018 CLINICAL DATA:  Impaired oxygenation. EXAM: PORTABLE CHEST 1 VIEW COMPARISON:  10/28/2018 FINDINGS: Normal heart size. Aortic atherosclerosis. There is mild bibasilar atelectasis. No airspace consolidation. Previous reverse left shoulder arthroplasty. IMPRESSION: 1. Bibasilar atelectasis. 2.  Aortic Atherosclerosis (ICD10-I70.0). Electronically Signed   By: Kerby Moors M.D.   On: 10/30/2018 07:27   Vas [place] Lower Extremity Venous (dvt)  Result Date: 10/29/2018  Lower Venous Study Indications: Edema. Other Indications: Suspected Pancreatic cancer with metastasis. Limitations: Body habitus. Comparison Study: No comparison study available. Performing Technologist: Rudell Cobb  Examination Guidelines: A complete evaluation includes B-mode imaging, spectral Doppler, color Doppler, and power Doppler as needed of all accessible portions of each vessel. Bilateral testing is considered an integral part of a complete examination. Limited examinations for reoccurring indications may be performed as noted.  Right Venous Findings: +---------+---------------+---------+-----------+----------+-------+          CompressibilityPhasicitySpontaneityPropertiesSummary +---------+---------------+---------+-----------+----------+-------+ CFV      Full           Yes      Yes                          +---------+---------------+---------+-----------+----------+-------+ SFJ       Full                                                 +---------+---------------+---------+-----------+----------+-------+ FV Prox  Full                                                 +---------+---------------+---------+-----------+----------+-------+ FV Mid   Full                                                 +---------+---------------+---------+-----------+----------+-------+ FV DistalFull                                                 +---------+---------------+---------+-----------+----------+-------+ PFV      Full                                                 +---------+---------------+---------+-----------+----------+-------+  POP      Full           Yes      Yes                          +---------+---------------+---------+-----------+----------+-------+ PTV      Full                                                 +---------+---------------+---------+-----------+----------+-------+ PERO     Full                                                 +---------+---------------+---------+-----------+----------+-------+  Left Venous Findings: +---------+---------------+---------+-----------+----------+-------+          CompressibilityPhasicitySpontaneityPropertiesSummary +---------+---------------+---------+-----------+----------+-------+ CFV      Full           Yes      Yes                          +---------+---------------+---------+-----------+----------+-------+ SFJ      Full                                                 +---------+---------------+---------+-----------+----------+-------+ FV Prox  Full                                                 +---------+---------------+---------+-----------+----------+-------+ FV Mid   Full                                                 +---------+---------------+---------+-----------+----------+-------+ FV DistalFull                                                  +---------+---------------+---------+-----------+----------+-------+ PFV      Full                                                 +---------+---------------+---------+-----------+----------+-------+ POP      Full           Yes      Yes                          +---------+---------------+---------+-----------+----------+-------+ PTV      Full                                                 +---------+---------------+---------+-----------+----------+-------+  PERO     Full                                                 +---------+---------------+---------+-----------+----------+-------+    Summary: Right: There is no evidence of deep vein thrombosis in the lower extremity. No cystic structure found in the popliteal fossa. Left: There is no evidence of deep vein thrombosis in the lower extremity. No cystic structure found in the popliteal fossa.  *See table(s) above for measurements and observations. Electronically signed by Servando Snare MD on 10/29/2018 at 1:30:41 PM.    Final         Scheduled Meds: . diltiazem  180 mg Oral Daily  . enoxaparin (LOVENOX) injection  40 mg Subcutaneous Daily  . gabapentin  300 mg Oral QHS  . levothyroxine  25 mcg Oral QAC breakfast  . metoprolol tartrate  50 mg Oral BID  . pantoprazole  40 mg Oral Daily  . raloxifene  60 mg Oral QHS  . ramipril  10 mg Oral BID  . cyanocobalamin  1,000 mcg Oral QHS   Continuous Infusions:   LOS: 3 days    Time spent: [place], MD [facility]  If 7PM-7AM, please contact night-coverage [email or web address] Password [facility] 10/30/2018, 10:34 AM

## 2018-10-30 NOTE — Progress Notes (Signed)
   10/30/18 2110  Vitals  BP (!) 129/47 (Provider notified/metoprolol held)  BP Location Left Arm  BP Method Automatic  Pulse Rate 84  Pulse Rate Source Monitor  MEWS Score  MEWS RR 0  MEWS Pulse 0  MEWS Systolic 0  MEWS LOC 0  MEWS Temp 0  MEWS Score 0  MEWS Score Color Green  MEWS Assessment  Is this an acute change? No  Provider Notification  Provider Name/Title C. Bodenheimer  Date Provider Notified 10/30/18  Time Provider Notified 2110  Notification Type Page  Notification Reason Other (Comment) (metoprolol/BP)  Response Other (Comment) (see MAR / mrtoprolol )  Date of Provider Response 10/30/18  Time of Provider Response 2130   Notified C. Bodenheimer of pt's diastolic BP and that metoprolol was due at this time. Verbal orders received to hold metoprolol until diastolic comes up more.

## 2018-10-31 ENCOUNTER — Inpatient Hospital Stay (HOSPITAL_COMMUNITY): Payer: Medicare Other

## 2018-10-31 LAB — CBC
HCT: 30.1 % — ABNORMAL LOW (ref 36.0–46.0)
Hemoglobin: 9.1 g/dL — ABNORMAL LOW (ref 12.0–15.0)
MCH: 27 pg (ref 26.0–34.0)
MCHC: 30.2 g/dL (ref 30.0–36.0)
MCV: 89.3 fL (ref 80.0–100.0)
Platelets: 371 K/uL (ref 150–400)
RBC: 3.37 MIL/uL — ABNORMAL LOW (ref 3.87–5.11)
RDW: 13.6 % (ref 11.5–15.5)
WBC: 24 10*3/uL — ABNORMAL HIGH (ref 4.0–10.5)
nRBC: 0 % (ref 0.0–0.2)

## 2018-10-31 NOTE — Care Management Important Message (Signed)
Important Message  Patient Details  Name: Ashlee Weeks MRN: [id number] Date of Birth: 1935-05-14   Medicare Important Message Given:  Yes    Kerin Salen 10/31/2018, 10:13 AMImportant Message  Patient Details  Name: Ashlee Weeks MRN: [id number] Date of Birth: 1935-02-22   Medicare Important Message Given:  Yes    Kerin Salen 10/31/2018, 10:13 AM

## 2018-10-31 NOTE — Discharge Instructions (Signed)
Liver Biopsy    The liver is a large organ in the upper right side of the abdomen. A liver biopsy is a procedure in which a tissue sample is taken from the liver and examined under a microscope.  There are three types of liver biopsies:  · Percutaneous. A needle is used to remove a sample through an incision in your abdomen.  · Laparoscopic. Several incisions are made in the abdomen. A sample is removed with the help of a tiny camera.  · Transjugular. An incision is made in your neck in the area of the jugular vein. A sample is removed through a small flexible tube that is passed down the blood vessel and into your liver.  Tell a health care provider about:  · Any allergies you have.  · All medicines you are taking, including vitamins, herbs, eye drops, creams, and over-the-counter medicines.  · Any problems you or family members have had with anesthetic medicines.  · Any blood disorders you have.  · Any surgeries you have had.  · Any medical conditions you have.  · Whether you are pregnant or may be pregnant.  What are the risks?  Generally, this is a safe procedure. However, problems can occur and include:  · Bleeding.  · Infection.  · Bruising.  · Pain.  · Injury to nearby organs or tissues, such as nerves, gallbladder, liver, or lungs.  What happens before the procedure?  Eating and drinking restrictions  · You may be asked not to drink or eat for 6-8 hours before the liver biopsy. You may be allowed to eat a light breakfast. Talk to your health care provider about when you should stop eating and drinking.  Medicines  Ask your health care provider about:  · Changing or stopping your regular medicines. This is especially important if you are taking diabetes medicines or blood thinners.  · Taking medicines such as aspirin and ibuprofen. These medicines can thin your blood. Do not take these medicines unless your health care provider tells you to take them.  · Taking over-the-counter medicines, vitamins, herbs, and  supplements.  General instructions  · Do not use any products that contain nicotine or tobacco, such as cigarettes and e-cigarettes. If you need help quitting, ask your health care provider.  · Plan to have someone take you home from the hospital or clinic.  · Plan to have a responsible adult care for you for at least 24 hours after you leave the hospital or clinic. This is important.  · You may have blood or urine tests.  · Ask your health care provider what steps will be taken to prevent infection. These may include:  ? Removing hair at the surgery site.  ? Washing skin with a germ-killing soap.  ? Taking antibiotic medicine.  What happens during the procedure?  · An IV will be inserted into one of your veins.  ? You will be given one or more of the following:  ? A medicine to help you relax (sedative).  ? A medicine to numb the area (local anesthetic).  ? A medicine to make you fall asleep (general anesthetic).  · Your health care provider will use one of the following procedures to remove samples from your liver. These procedures may vary among health care providers and hospitals.  Percutaneous liver biopsy  · You will lie on your back, with your right hand over your head.  · A health care provider will locate your liver by   tapping and pressing on the right side of your abdomen, or by using an ultrasound or CT scan.  · A local anesthetic will be used to numb an area at the bottom of your last right rib.  · A small incision will be made in the numbed area.  · A biopsy needle will be inserted into the incision.  · Several samples of liver tissue will be taken. You will be asked to hold your breath as each sample is taken.  · The incision will be closed with stitches (sutures).  · A bandage (dressing) may be placed over the incision.  Laparoscopic liver biopsy  · You will lie on your back.  · Several small incisions will be made in your abdomen.  · Your health care provider will pass a tiny camera through one  incision. The camera will allow the liver to be viewed on a TV monitor in the operating room.  · Tools will be passed through the other incision or incisions.  · Samples of the liver will be removed using the tools.  · The incisions will be closed with stitches (sutures).  · A bandage (dressing) may be placed over the incisions.  Transjugular liver biopsy  · You will lie on your back on an X-ray table, with your head turned to your left.  · An area on your neck, just over your jugular vein, will be numbed.  · An incision will be made in the numbed area.  · A tiny tube will be inserted through the incision. The tube will be passed into the jugular vein to a blood vessel in the liver called the hepatic vein.  · A dye will be injected through the tube.  · X-rays will be taken. The dye will make the blood vessels in the liver light up on the X-rays.  · The biopsy needle will be placed through the tube until it reaches the liver.  · Samples of liver tissue will be taken with the biopsy needle.  · The needle and the tube will be removed.  · The incision will be closed with stitches (sutures).  · A bandage (dressing) may be placed over the incision.  What happens after the procedure?  · Your blood pressure, heart rate, breathing rate, and blood oxygen level will be monitored until you leave the hospital or clinic.  · You will be asked to rest quietly for 2-4 hours or longer.  · You will be closely monitored for bleeding from the biopsy site.  · You may be allowed to go home when the medicines have worn off and you can walk, drink, eat, and use the bathroom.  Summary  · A liver biopsy is a procedure in which a tissue sample is taken from the liver and examined under a microscope.  · This is a safe procedure, but problems can occur, including bleeding, infection, pain, or injury to nearby organs or tissues.  · Ask your health care provider about changing or stopping your regular medicines.  · Plan to have someone take you  home from the hospital or clinic and to be with you for 24 hours after the procedure.  This information is not intended to replace advice given to you by your health care provider. Make sure you discuss any questions you have with your health care provider.  Document Released: 12/12/2003 Document Revised: 10/01/2017 Document Reviewed: 10/01/2017  [employer] Patient Education © 2019 [employer]

## 2018-10-31 NOTE — Discharge Summary (Signed)
Physician Discharge Summary  Ashlee Weeks DZH:[id number] DOB: September 06, 1935 DOA: 10/27/2018  PCP: Hoyt Koch, MD  Admit date: 10/27/2018 Discharge date: 10/31/2018  Admitted From: Home Disposition:  Home  Recommendations for Outpatient Follow-up:  1. Follow up with PCP in 1-2 weeks 2. Please obtain BMP/CBC in one week 3. Please follow-up as an outpatient for your liver biopsy on 11/02/2018 4. Please make an oncology appointment which they will call you with.  Home Health: No Equipment/Devices:2LNC  Discharge Condition: Stable CODE STATUS: Full Diet recommendation: Regular  Brief/Interim Summary:  #) Acute hypoxia: Patient was noted to be hypoxic yesterday.  Chest x-ray showed evidence only of atelectasis.  CTA of the chest showed no evidence of pulmonary embolism.  Patient continued to desaturate while ambulating and so she was sent home with an incentive spirometer and 2 L nasal cannula to wean as tolerated.  #) Sepsis ruled out/fever/elevated white blood cell count: As an outpatient patient has been having quite elevated white blood cell count and persistent fevers for several weeks.  Patient has had CT imaging of the chest abdomen and pelvis that showed no infectious process but only metastatic likely prostate cancer.  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  Patient was observed off antibiotics and did not have any vital sign instability.  Patient was noted to have negative blood cultures on 10/27/2018.  Urine cultures on 10/27/2018 grew out only 10,000 colonies of Pseudomonas patient was asymptomatic.  CTA of the chest and lower extremity DVT ultrasound showed no evidence of clot.  #) Metastatic cancer: Patient was noted to have likely metastatic pancreatic cancer at the tail of the pancreas with metastases to the liver as well as possible spiculated lesions in the lungs.  Interventional radiology was consulted  and would perform a biopsy as an outpatient on Wednesday, 11/02/2018.  Oncology was consulted and would recheck to the patient.  #) Pretension: Patient was continued on diltiazem, metoprolol, ramipril.  #) Hypothyroidism: Patient was continued on home levothyroxine. #) Bladder spasms: Patient was continued on mirabegron.  #) Pain/psych: Patient was continued on gabapentin.  Discharge Diagnoses:  Principal Problem:   Sepsis ([place]) Active Problems:   Essential hypertension   Hypothyroidism   Pyuria   Pancreatic cancer metastasized to liver [facility]) suspected on CT   Fever    Discharge Instructions  Discharge Instructions    Call MD for:  difficulty breathing, headache or visual disturbances   Complete by:  As directed    Call MD for:  hives   Complete by:  As directed    Call MD for:  persistant nausea and vomiting   Complete by:  As directed    Call MD for:  redness, tenderness, or signs of infection (pain, swelling, redness, odor or green/yellow discharge around incision site)   Complete by:  As directed    Call MD for:  severe uncontrolled pain   Complete by:  As directed    Call MD for:  temperature >100.4   Complete by:  As directed    Diet - low sodium heart healthy   Complete by:  As directed    Discharge instructions   Complete by:  As directed    Please follow-up with your primary care doctor in 1 week.  Please follow-up with the the interventional radiologist for the biopsy of your liver on 11/02/2018.  Please follow-up with the oncology staff when a clinic appointment is made.  Increase activity slowly   Complete by:  As directed      Allergies as of 10/31/2018      Reactions   Fosamax [alendronate Sodium] Other (See Comments)   Joint Swelling      Medication List    STOP taking these medications   cephALEXin 500 MG capsule Commonly known as:  KEFLEX     TAKE these medications   CALTRATE 600+D PO Take 1 tablet by mouth every Monday, Wednesday, and  Friday.   cholestyramine light 4 g packet Commonly known as:  PREVALITE Take 1 packet (4 g total) by mouth 2 (two) times daily.   cyanocobalamin 1000 MCG tablet Take 1,000 mcg by mouth at bedtime.   diltiazem 180 MG 24 hr capsule Commonly known as:  CARDIZEM CD Take 1 capsule (180 mg total) by mouth daily.   gabapentin 300 MG capsule Commonly known as:  NEURONTIN Take 1 capsule (300 mg total) by mouth at bedtime.   ibuprofen 200 MG tablet Commonly known as:  ADVIL,MOTRIN Take 400 mg by mouth daily as needed for moderate pain.   levothyroxine 25 MCG tablet Commonly known as:  SYNTHROID, LEVOTHROID Take 1 tablet (25 mcg total) by mouth daily before breakfast.   metoprolol tartrate 50 MG tablet Commonly known as:  LOPRESSOR TAKE 1 TABLET BY MOUTH TWICE A DAY   mirabegron ER 50 MG Tb24 tablet Commonly known as:  MYRBETRIQ Take 1 tablet (50 mg total) by mouth daily.   multivitamin with minerals tablet Take 1 tablet by mouth at bedtime.   omeprazole 20 MG capsule Commonly known as:  PRILOSEC Take 1 capsule (20 mg total) by mouth daily.   raloxifene 60 MG tablet Commonly known as:  EVISTA TAKE 1 TABLET BY MOUTH EVERY DAY What changed:  when to take this   ramipril 10 MG capsule Commonly known as:  ALTACE TAKE 1 CAPSULE BY MOUTH 2 TIMES DAILY. What changed:  See the new instructions.   Vitamin D-3 25 MCG (1000 UT) Caps Take 1,000 Units by mouth 2 (two) times daily. Take one by mouth twice daily            Durable Medical Equipment  (From admission, onward)         Start     Ordered   10/31/18 1036  DME Oxygen  Once    Question Answer Comment  Mode or (Route) Nasal cannula   Liters per Minute 2   Frequency Continuous (stationary and portable oxygen unit needed)   Oxygen conserving device Yes   Oxygen delivery system Gas      10/31/18 1035          Allergies  Allergen Reactions  . Fosamax [Alendronate Sodium] Other (See Comments)    Joint  Swelling    Consultations:  Interventional radiology   Procedures/Studies: Dg Chest 2 View  Result Date: 10/27/2018 CLINICAL DATA:  Leukocytosis EXAM: CHEST - 2 VIEW COMPARISON:  10/21/2018 FINDINGS: Streaky atelectasis or scarring at the bases. No pleural effusion. Borderline cardiomegaly. No pneumothorax. Left shoulder replacement. IMPRESSION: Streaky bibasilar atelectasis or scarring Electronically Signed   By: Donavan Foil M.D.   On: 10/27/2018 20:59   Dg Chest 2 View  Result Date: 10/21/2018 CLINICAL DATA:  Fever.  Elevated white count EXAM: CHEST - 2 VIEW COMPARISON:  None. FINDINGS: Mild bibasilar airspace disease, probably atelectasis. No definite pneumonia. Negative for heart failure edema or effusion. Left shoulder placement IMPRESSION: Mild bibasilar airspace disease most likely atelectasis. Electronically Signed  By: Franchot Gallo M.D.   On: 10/21/2018 19:28   Ct Angio Chest Pe W Or Wo Contrast  Result Date: 10/28/2018 CLINICAL DATA:  New diagnosis of pancreatic cancer with metastatic disease to the liver yesterday. EXAM: CT ANGIOGRAPHY CHEST WITH CONTRAST TECHNIQUE: Multidetector CT imaging of the chest was performed using the standard protocol during bolus administration of intravenous contrast. Multiplanar CT image reconstructions and MIPs were obtained to evaluate the vascular anatomy. CONTRAST:  161mL ISOVUE-370 IOPAMIDOL (ISOVUE-370) INJECTION 76% COMPARISON:  Chest radiography 10/27/2018 FINDINGS: Cardiovascular: Pulmonary arterial opacification is good. No visible pulmonary emboli. The study shows some motion degradation. There is aortic atherosclerosis without aneurysm or dissection. There is coronary artery calcification. Heart size is normal. Mediastinum/Nodes: No mediastinal or hilar mass or lymphadenopathy. Lungs/Pleura: There is mild bilateral lower lobe atelectasis. There is no pleural effusion. There is an 8 mm slightly spiculated nodule in the left upper lobe image  38. There is an 11 mm slightly spiculated nodule in the superior segment right lower lobe image 52. In this setting these are worrisome for pulmonary metastases. Upper Abdomen: See results of scan yesterday. Musculoskeletal: No lytic lesions seen. Ordinary spinal degenerative change. Review of the MIP images confirms the above findings. IMPRESSION: 1. No pulmonary emboli. 2. 8 mm slightly spiculated nodule in the left upper lobe. 11 mm slightly spiculated nodule in the superior segment right lower lobe. These are worrisome for pulmonary metastases. 3. Coronary artery calcification. Aortic Atherosclerosis (ICD10-I70.0). Electronically Signed   By: Nelson Chimes M.D.   On: 10/28/2018 19:01   Ct Abdomen Pelvis W Contrast  Result Date: 10/27/2018 CLINICAL DATA:  84 year old female with fever of unknown origin. Recent diagnosis of cystitis. EXAM: CT ABDOMEN AND PELVIS WITH CONTRAST TECHNIQUE: Multidetector CT imaging of the abdomen and pelvis was performed using the standard protocol following bolus administration of intravenous contrast. CONTRAST:  145mL ISOVUE-300 IOPAMIDOL (ISOVUE-300) INJECTION 61% COMPARISON:  None. FINDINGS: Lower chest: There are bibasilar linear atelectasis/scarring. There is mild cardiomegaly. Multi vessel coronary vascular calcification. No intra-abdominal free air or free fluid. Hepatobiliary: Multiple (greater than 20) hypodense lesions throughout the liver measure up to 2.5 x 3.0 cm in the right lobe of the liver most consistent with metastatic disease. There is no intrahepatic biliary ductal dilatation. Cholecystectomy. Pancreas: There is a 3.2 x 3.9 x 4.2 cm complex hypodense/hypoenhancing mass in the distal body/tail of the pancreas most consistent with malignancy. This likely represents a primary neoplasm of the pancreas such as adenoma carcinoma versus less likely metastatic disease to the pancreas. There is atrophy of the pancreatic tail. There is a 2.0 x 2.0 cm rounded density  adjacent to the tail of the pancreas in the region of the splenic hilum consistent with a satellite lesion/metastasis. Spleen: Normal in size without focal abnormality. Adrenals/Urinary Tract: There is a 9 mm left adrenal nodule, indeterminate. The right adrenal gland is unremarkable. The kidneys, and the visualized ureters appear unremarkable. The urinary bladder is collapsed. Stomach/Bowel: There is sigmoid diverticulosis without active inflammatory changes. There is a small hiatal hernia. A 2 cm duodenal diverticulum is also noted. There is no bowel obstruction or active inflammation. Appendectomy. Vascular/Lymphatic: There is moderate aortoiliac atherosclerotic disease. No portal venous gas. There is no adenopathy. Reproductive: The uterus and ovaries are grossly unremarkable. Other: None Musculoskeletal: There is multilevel degenerative changes of the spine. There is a transitional vertebra. There is grade 1 L5-S1 anterolisthesis (anterolisthesis of the disc space superior to the transitional vertebra). No acute osseous  pathology. IMPRESSION: 1. Complex mass in the distal body/tail of the pancreas most consistent with pancreatic malignancy. There is a satellite lesion/metastasis adjacent to the tail of the pancreas. Further evaluation with pancreatic protocol MRI on a nonemergent basis recommended. 2. Hepatic metastatic disease. 3. Indeterminate left adrenal nodule. 4. Sigmoid diverticulosis. No bowel obstruction or active inflammation. Electronically Signed   By: Anner Crete M.D.   On: 10/27/2018 21:01   Dg Chest Port 1 View  Result Date: 10/31/2018 CLINICAL DATA:  84 year old female with hypoxia. Shortness breath. Hypertension. Subsequent encounter. EXAM: PORTABLE CHEST 1 VIEW COMPARISON:  10/30/2018 chest x-ray.  10/28/2018 chest CT. FINDINGS: Cardiomegaly. Elevated right hemidiaphragm. Bibasilar atelectasis suspected. Limited for evaluating for lower lobe infiltrate or pleural effusion. CT detected  left upper lobe and superior segment right lower lobe 8 mm and 11 mm spiculated nodules not well delineated by plain film exam. Calcified aorta. Post left shoulder replacement. IMPRESSION: 1. Cardiomegaly. 2. Elevated right hemidiaphragm. 3. Bibasilar atelectasis suspected. Limited for evaluating for lower lobe infiltrate or pleural effusion. 4. CT detected left upper lobe and superior segment right lower lobe 8 mm and 11 mm spiculated nodules not well delineated by plain film exam. Electronically Signed   By: Genia Del M.D.   On: 10/31/2018 07:49   Dg Chest Port 1 View  Result Date: 10/30/2018 CLINICAL DATA:  Impaired oxygenation. EXAM: PORTABLE CHEST 1 VIEW COMPARISON:  10/28/2018 FINDINGS: Normal heart size. Aortic atherosclerosis. There is mild bibasilar atelectasis. No airspace consolidation. Previous reverse left shoulder arthroplasty. IMPRESSION: 1. Bibasilar atelectasis. 2.  Aortic Atherosclerosis (ICD10-I70.0). Electronically Signed   By: Kerby Moors M.D.   On: 10/30/2018 07:27   Vas [place] Lower Extremity Venous (dvt)  Result Date: 10/29/2018  Lower Venous Study Indications: Edema. Other Indications: Suspected Pancreatic cancer with metastasis. Limitations: Body habitus. Comparison Study: No comparison study available. Performing Technologist: Rudell Cobb  Examination Guidelines: A complete evaluation includes B-mode imaging, spectral Doppler, color Doppler, and power Doppler as needed of all accessible portions of each vessel. Bilateral testing is considered an integral part of a complete examination. Limited examinations for reoccurring indications may be performed as noted.  Right Venous Findings: +---------+---------------+---------+-----------+----------+-------+          CompressibilityPhasicitySpontaneityPropertiesSummary +---------+---------------+---------+-----------+----------+-------+ CFV      Full           Yes      Yes                           +---------+---------------+---------+-----------+----------+-------+ SFJ      Full                                                 +---------+---------------+---------+-----------+----------+-------+ FV Prox  Full                                                 +---------+---------------+---------+-----------+----------+-------+ FV Mid   Full                                                 +---------+---------------+---------+-----------+----------+-------+  FV DistalFull                                                 +---------+---------------+---------+-----------+----------+-------+ PFV      Full                                                 +---------+---------------+---------+-----------+----------+-------+ POP      Full           Yes      Yes                          +---------+---------------+---------+-----------+----------+-------+ PTV      Full                                                 +---------+---------------+---------+-----------+----------+-------+ PERO     Full                                                 +---------+---------------+---------+-----------+----------+-------+  Left Venous Findings: +---------+---------------+---------+-----------+----------+-------+          CompressibilityPhasicitySpontaneityPropertiesSummary +---------+---------------+---------+-----------+----------+-------+ CFV      Full           Yes      Yes                          +---------+---------------+---------+-----------+----------+-------+ SFJ      Full                                                 +---------+---------------+---------+-----------+----------+-------+ FV Prox  Full                                                 +---------+---------------+---------+-----------+----------+-------+ FV Mid   Full                                                  +---------+---------------+---------+-----------+----------+-------+ FV DistalFull                                                 +---------+---------------+---------+-----------+----------+-------+ PFV      Full                                                 +---------+---------------+---------+-----------+----------+-------+  POP      Full           Yes      Yes                          +---------+---------------+---------+-----------+----------+-------+ PTV      Full                                                 +---------+---------------+---------+-----------+----------+-------+ PERO     Full                                                 +---------+---------------+---------+-----------+----------+-------+    Summary: Right: There is no evidence of deep vein thrombosis in the lower extremity. No cystic structure found in the popliteal fossa. Left: There is no evidence of deep vein thrombosis in the lower extremity. No cystic structure found in the popliteal fossa.  *See table(s) above for measurements and observations. Electronically signed by Servando Snare MD on 10/29/2018 at 1:30:41 PM.    Final        Subjective:   Discharge Exam: Vitals:   10/30/18 2145 10/31/18 0519  BP: (!) 122/51 (!) 125/59  Pulse: 86 92  Resp: 16 16  Temp: 98 F (36.7 C) 98.8 F (37.1 C)  SpO2: 98% 92%   Vitals:   10/30/18 1947 10/30/18 2110 10/30/18 2145 10/31/18 0519  BP: 136/61 (!) 129/47 (!) 122/51 (!) 125/59  Pulse: 75 84 86 92  Resp: 16  16 16   Temp: 98.3 F (36.8 C)  98 F (36.7 C) 98.8 F (37.1 C)  TempSrc: Oral  Oral Oral  SpO2: 96%  98% 92%  Weight:      Height:        General exam: Appears calm and comfortable  Respiratory system: Clear to auscultation. Respiratory effort normal. Cardiovascular system: Regular rate and rhythm, no murmurs Gastrointestinal system: Abdomen is nondistended, soft and nontender. No organomegaly or masses felt. Normal bowel  sounds heard. Central nervous system: Alert and oriented.  Is intact, moving all extremities Extremities: 1+ lower extremity edema. Skin: No rashes visible skin Psychiatry: Judgement and insight appear normal. Mood & affect appropriate.      The results of significant diagnostics from this hospitalization (including imaging, microbiology, ancillary and laboratory) are listed below for reference.     Microbiology: Recent Results (from the past 240 hour(s))  Blood culture (routine x 2)     Status: None   Collection Time: 10/21/18  6:26 PM  Result Value Ref Range Status   Specimen Description   Final    BLOOD RIGHT HAND Performed at [facility] [address] [place], [place] [address]    Special Requests   Final    BOTTLES DRAWN AEROBIC AND ANAEROBIC Blood Culture adequate volume Performed at [facility] [address] [place], [place] [address]    Culture   Final    NO GROWTH 5 DAYS Performed at [facility], [place] [address] [place], [place] [address]    Report Status 10/26/2018 FINAL  Final  Blood culture (routine x 2)     Status: Abnormal   Collection Time: 10/21/18  6:37 PM  Result Value Ref Range Status   Specimen Description   Final    BLOOD LEFT HAND Performed at [facility] [address], [place], [place] [address]    Special Requests   Final    BOTTLES DRAWN AEROBIC ONLY Blood Culture results may not be optimal due to an inadequate volume of blood received in culture bottles Performed at [facility] [address], [place], [place] [address]    Culture  Setup Time   Final    GRAM POSITIVE COCCI IN CLUSTERS AEROBIC BOTTLE ONLY CRITICAL RESULT CALLED TO, READ BACK BY AND VERIFIED WITH: S. GOUGE, RN ([place]) AT [place] ON 10/22/18 BY C. JESSUP, MLT.    Culture (A)  Final    STAPHYLOCOCCUS SPECIES (COAGULASE NEGATIVE) THE SIGNIFICANCE OF ISOLATING THIS ORGANISM FROM A SINGLE SET  OF BLOOD CULTURES WHEN MULTIPLE SETS ARE DRAWN IS UNCERTAIN. PLEASE NOTIFY THE MICROBIOLOGY DEPARTMENT WITHIN ONE WEEK IF SPECIATION AND SENSITIVITIES ARE REQUIRED. Performed at [facility], [place] [address] [place], [place] [address]    Report Status 10/24/2018 FINAL  Final  Blood Culture ID Panel (Reflexed)     Status: Abnormal   Collection Time: 10/21/18  6:37 PM  Result Value Ref Range Status   Enterococcus species NOT DETECTED NOT DETECTED Final   Listeria monocytogenes NOT DETECTED NOT DETECTED Final   Staphylococcus species DETECTED (A) NOT DETECTED Final    Comment: Methicillin (oxacillin) susceptible coagulase negative staphylococcus. Possible blood culture contaminant (unless isolated from more than one blood culture draw or clinical case suggests pathogenicity). No antibiotic treatment is indicated for blood  culture contaminants. CRITICAL RESULT CALLED TO, READ BACK BY AND VERIFIED WITH: S. GOUGE, RN ([facility]) AT 4163 ON 10/22/18 BY C. JESSUP, MLT.    Staphylococcus aureus (BCID) NOT DETECTED NOT DETECTED Final   Methicillin resistance NOT DETECTED NOT DETECTED Final   Streptococcus species NOT DETECTED NOT DETECTED Final   Streptococcus agalactiae NOT DETECTED NOT DETECTED Final   Streptococcus pneumoniae NOT DETECTED NOT DETECTED Final   Streptococcus pyogenes NOT DETECTED NOT DETECTED Final   Acinetobacter baumannii NOT DETECTED NOT DETECTED Final   Enterobacteriaceae species NOT DETECTED NOT DETECTED Final   Enterobacter cloacae complex NOT DETECTED NOT DETECTED Final   Escherichia coli NOT DETECTED NOT DETECTED Final   Klebsiella oxytoca NOT DETECTED NOT DETECTED Final   Klebsiella pneumoniae NOT DETECTED NOT DETECTED Final   Proteus species NOT DETECTED NOT DETECTED Final   Serratia marcescens NOT DETECTED NOT DETECTED Final   Haemophilus influenzae NOT DETECTED NOT DETECTED Final   Neisseria meningitidis NOT DETECTED NOT DETECTED Final   Pseudomonas aeruginosa NOT  DETECTED NOT DETECTED Final   Candida albicans NOT DETECTED NOT DETECTED Final   Candida glabrata NOT DETECTED NOT DETECTED Final   Candida krusei NOT DETECTED NOT DETECTED Final   Candida parapsilosis NOT DETECTED NOT DETECTED Final   Candida tropicalis NOT DETECTED NOT DETECTED Final    Comment: Performed at [facility], [place] [address] [place], [place] [address]  Urine culture     Status: None   Collection Time: 10/21/18  7:59 PM  Result Value Ref Range Status   Specimen Description   Final    URINE, RANDOM Performed at [facility] [address] [place], [place] [address]    Special Requests   Final    NONE Performed at [facility], [place] [address], [place], [place] [address]  Culture   Final    NO GROWTH Performed at [facility], [place] [address], [place], [place] [address]    Report Status 10/23/2018 FINAL  Final  Urine culture     Status: Abnormal   Collection Time: 10/27/18  7:22 PM  Result Value Ref Range Status   Specimen Description URINE, RANDOM  Final   Special Requests   Final    NONE Performed at [facility], [place] [address] [place], [place] [address]    Culture 10,000 COLONIES/mL PSEUDOMONAS AERUGINOSA (A)  Final   Report Status 10/30/2018 FINAL  Final   Organism ID, Bacteria PSEUDOMONAS AERUGINOSA (A)  Final      Susceptibility   Pseudomonas aeruginosa - MIC*    CEFTAZIDIME 2 SENSITIVE Sensitive     CIPROFLOXACIN <=0.25 SENSITIVE Sensitive     GENTAMICIN <=1 SENSITIVE Sensitive     IMIPENEM 2 SENSITIVE Sensitive     PIP/TAZO <=4 SENSITIVE Sensitive     CEFEPIME <=1 SENSITIVE Sensitive     * 10,000 COLONIES/mL PSEUDOMONAS AERUGINOSA  Culture, blood (routine x 2)     Status: None (Preliminary result)   Collection Time: 10/27/18  7:22 PM  Result Value Ref Range Status   Specimen Description   Final    BLOOD RIGHT ANTECUBITAL Performed at [facility]  [address], [place], [place] [address]    Special Requests   Final    BOTTLES DRAWN AEROBIC AND ANAEROBIC Blood Culture adequate volume Performed at [facility] [address], [place], [place] [address]    Culture   Final    NO GROWTH 2 DAYS Performed at [facility] [address], [place], [place] [address]    Report Status PENDING  Incomplete  Culture, blood (routine x 2)     Status: None (Preliminary result)   Collection Time: 10/27/18  7:27 PM  Result Value Ref Range Status   Specimen Description   Final    BLOOD LEFT HAND Performed at [facility] [address], [place], [place] [address]    Special Requests   Final    BOTTLES DRAWN AEROBIC ONLY Blood Culture adequate volume Performed at [facility] [address], [place], [place] [address]    Culture   Final    NO GROWTH 2 DAYS Performed at [facility] [address] [place], [place] [address]    Report Status PENDING  Incomplete     Labs: BNP (last 3 results) Recent Labs    10/30/18 1043  BNP 35.3   Basic Metabolic Panel: Recent Labs  Lab 10/27/18 1823 10/28/18 0113 10/28/18 1145 10/29/18 0410  NA 136  --   --  137  K 3.8  --   --  4.1  CL 99  --   --  102  CO2 27  --   --  27  GLUCOSE 166*  --   --  134*  BUN 10  --   --  7*  CREATININE 0.79 0.83  --  0.67  CALCIUM 8.5*  --   --  8.5*  MG  --   --   --  2.2  PHOS  --   --  3.2  --    Liver Function Tests: Recent Labs  Lab 10/27/18 1823 10/29/18 0410  AST 28 20  ALT 25 20  ALKPHOS 135* 129*  BILITOT 0.4 0.3  PROT 6.8 6.0*  ALBUMIN  2.9* 2.5*   Recent Labs  Lab 10/27/18 1823  LIPASE 37   No results for input(s): AMMONIA in the last 168 hours. CBC: Recent Labs  Lab 10/27/18 1823 10/27/18 1922 10/28/18 0113 10/29/18 0410 10/30/18 0407 10/31/18 0356  WBC 25.5*  --  23.4* 25.3* 23.6* 24.0*  NEUTROABS  --  22.7*  --  20.3*  --   --   HGB 10.3*  --  9.9*  9.4* 9.2* 9.1*  HCT 34.1*  --  32.5* 31.4* 30.7* 30.1*  MCV 91.7  --  91.5 92.4 90.3 89.3  PLT 428*  --  412* PLATELET CLUMPS NOTED ON SMEAR, COUNT APPEARS INCREASED 417* 371   Cardiac Enzymes: Recent Labs  Lab 10/27/18 1922  TROPONINI <0.03   BNP: Invalid input(s): POCBNP CBG: No results for input(s): GLUCAP in the last 168 hours. D-Dimer No results for input(s): DDIMER in the last 72 hours. Hgb A1c No results for input(s): HGBA1C in the last 72 hours. Lipid Profile No results for input(s): CHOL, HDL, LDLCALC, TRIG, CHOLHDL, LDLDIRECT in the last 72 hours. Thyroid function studies No results for input(s): TSH, T4TOTAL, T3FREE, THYROIDAB in the last 72 hours.  Invalid input(s): FREET3 Anemia work up No results for input(s): VITAMINB12, FOLATE, FERRITIN, TIBC, IRON, RETICCTPCT in the last 72 hours. Urinalysis    Component Value Date/Time   COLORURINE YELLOW 10/27/2018 1802   APPEARANCEUR HAZY (A) 10/27/2018 1802   LABSPEC 1.025 10/27/2018 1802   PHURINE 5.0 10/27/2018 1802   GLUCOSEU NEGATIVE 10/27/2018 1802   HGBUR NEGATIVE 10/27/2018 1802   BILIRUBINUR NEGATIVE 10/27/2018 1802   KETONESUR 5 (A) 10/27/2018 1802   PROTEINUR NEGATIVE 10/27/2018 1802   NITRITE NEGATIVE 10/27/2018 1802   LEUKOCYTESUR SMALL (A) 10/27/2018 1802   Sepsis Labs Invalid input(s): PROCALCITONIN,  WBC,  LACTICIDVEN Microbiology Recent Results (from the past 240 hour(s))  Blood culture (routine x 2)     Status: None   Collection Time: 10/21/18  6:26 PM  Result Value Ref Range Status   Specimen Description   Final    BLOOD RIGHT HAND Performed at [facility], [place] [address], [place], [place] [address]    Special Requests   Final    BOTTLES DRAWN AEROBIC AND ANAEROBIC Blood Culture adequate volume Performed at [facility] [address], [place], [place] [address]    Culture   Final    NO GROWTH 5 DAYS Performed at [facility], [place]  [address], [place], [place] [address]    Report Status 10/26/2018 FINAL  Final  Blood culture (routine x 2)     Status: Abnormal   Collection Time: 10/21/18  6:37 PM  Result Value Ref Range Status   Specimen Description   Final    BLOOD LEFT HAND Performed at [facility] [address] [place], [place] [address]    Special Requests   Final    BOTTLES DRAWN AEROBIC ONLY Blood Culture results may not be optimal due to an inadequate volume of blood received in culture bottles Performed at [facility] [address], [place], [place] [address]    Culture  Setup Time   Final    GRAM POSITIVE COCCI IN CLUSTERS AEROBIC BOTTLE ONLY CRITICAL RESULT CALLED TO, READ BACK BY AND VERIFIED WITH: S. GOUGE, RN ([place]) AT [place] ON 10/22/18 BY C. JESSUP, MLT.    Culture (A)  Final    STAPHYLOCOCCUS SPECIES (COAGULASE NEGATIVE) THE SIGNIFICANCE OF ISOLATING  THIS ORGANISM FROM A SINGLE SET OF BLOOD CULTURES WHEN MULTIPLE SETS ARE DRAWN IS UNCERTAIN. PLEASE NOTIFY THE MICROBIOLOGY DEPARTMENT WITHIN ONE WEEK IF SPECIATION AND SENSITIVITIES ARE REQUIRED. Performed at [facility], [place] [address], [place], [place] [address]    Report Status 10/24/2018 FINAL  Final  Blood Culture ID Panel (Reflexed)     Status: Abnormal   Collection Time: 10/21/18  6:37 PM  Result Value Ref Range Status   Enterococcus species NOT DETECTED NOT DETECTED Final   Listeria monocytogenes NOT DETECTED NOT DETECTED Final   Staphylococcus species DETECTED (A) NOT DETECTED Final    Comment: Methicillin (oxacillin) susceptible coagulase negative staphylococcus. Possible blood culture contaminant (unless isolated from more than one blood culture draw or clinical case suggests pathogenicity). No antibiotic treatment is indicated for blood  culture contaminants. CRITICAL RESULT CALLED TO, READ BACK BY AND VERIFIED WITH: S. GOUGE, RN ([facility]) AT 5732 ON 10/22/18 BY C. JESSUP, MLT.    Staphylococcus  aureus (BCID) NOT DETECTED NOT DETECTED Final   Methicillin resistance NOT DETECTED NOT DETECTED Final   Streptococcus species NOT DETECTED NOT DETECTED Final   Streptococcus agalactiae NOT DETECTED NOT DETECTED Final   Streptococcus pneumoniae NOT DETECTED NOT DETECTED Final   Streptococcus pyogenes NOT DETECTED NOT DETECTED Final   Acinetobacter baumannii NOT DETECTED NOT DETECTED Final   Enterobacteriaceae species NOT DETECTED NOT DETECTED Final   Enterobacter cloacae complex NOT DETECTED NOT DETECTED Final   Escherichia coli NOT DETECTED NOT DETECTED Final   Klebsiella oxytoca NOT DETECTED NOT DETECTED Final   Klebsiella pneumoniae NOT DETECTED NOT DETECTED Final   Proteus species NOT DETECTED NOT DETECTED Final   Serratia marcescens NOT DETECTED NOT DETECTED Final   Haemophilus influenzae NOT DETECTED NOT DETECTED Final   Neisseria meningitidis NOT DETECTED NOT DETECTED Final   Pseudomonas aeruginosa NOT DETECTED NOT DETECTED Final   Candida albicans NOT DETECTED NOT DETECTED Final   Candida glabrata NOT DETECTED NOT DETECTED Final   Candida krusei NOT DETECTED NOT DETECTED Final   Candida parapsilosis NOT DETECTED NOT DETECTED Final   Candida tropicalis NOT DETECTED NOT DETECTED Final    Comment: Performed at [facility], [place] [address] [place], [place] [address]  Urine culture     Status: None   Collection Time: 10/21/18  7:59 PM  Result Value Ref Range Status   Specimen Description   Final    URINE, RANDOM Performed at [facility] [address], [place], [place] [address]    Special Requests   Final    NONE Performed at [facility], [place] [address] [place],  [address]    Culture   Final    NO GROWTH Performed at [facility], [place] [address] [place],  [address]    Report Status 10/23/2018 FINAL  Final  Urine culture     Status: Abnormal   Collection Time: 10/27/18  7:22 PM  Result Value Ref Range  Status   Specimen Description URINE, RANDOM  Final   Special Requests   Final    NONE Performed at [facility], [place] [address], [place], [place] [address]    Culture 10,000 COLONIES/mL PSEUDOMONAS AERUGINOSA (A)  Final   Report Status 10/30/2018 FINAL  Final   Organism ID, Bacteria PSEUDOMONAS AERUGINOSA (A)  Final      Susceptibility   Pseudomonas aeruginosa - MIC*    CEFTAZIDIME 2 SENSITIVE Sensitive     CIPROFLOXACIN <=  0.25 SENSITIVE Sensitive     GENTAMICIN <=1 SENSITIVE Sensitive     IMIPENEM 2 SENSITIVE Sensitive     PIP/TAZO <=4 SENSITIVE Sensitive     CEFEPIME <=1 SENSITIVE Sensitive     * 10,000 COLONIES/mL PSEUDOMONAS AERUGINOSA  Culture, blood (routine x 2)     Status: None (Preliminary result)   Collection Time: 10/27/18  7:22 PM  Result Value Ref Range Status   Specimen Description   Final    BLOOD RIGHT ANTECUBITAL Performed at [facility] [address] [place], [place] [address]    Special Requests   Final    BOTTLES DRAWN AEROBIC AND ANAEROBIC Blood Culture adequate volume Performed at [facility] [address] [place], [place] [address]    Culture   Final    NO GROWTH 2 DAYS Performed at [facility] [address] [place], [place] [address]    Report Status PENDING  Incomplete  Culture, blood (routine x 2)     Status: None (Preliminary result)   Collection Time: 10/27/18  7:27 PM  Result Value Ref Range Status   Specimen Description   Final    BLOOD LEFT HAND Performed at [facility] [address] [place], [place] [address]    Special Requests   Final    BOTTLES DRAWN AEROBIC ONLY Blood Culture adequate volume Performed at [facility] [address], [place], [place] [address]    Culture   Final    NO GROWTH 2 DAYS Performed at [facility] [address], [place],  [address]    Report Status PENDING  Incomplete      Time coordinating discharge: 38  SIGNED:   Cristy Folks, MD  [facility] 10/31/2018, 10:36 AM  If 7PM-7AM, please contact night-coverage [email or web address] Password [email or web address]

## 2018-10-31 NOTE — Care Management Note (Signed)
Case Management Note  Patient Details  Name: Malli Falotico MRN: [id number] Date of Birth: [birth year]  Subjective/Objective:                  discharged  Action/Plan: Home o2 ordered and [facility] to [facility]   Expected Discharge Date:  10/31/18               Expected Discharge Plan:  [place]  In-House Referral:     Discharge planning Services  CM Consult  Post Acute Care Choice:  Durable Medical Equipment Choice offered to:  Patient  DME Arranged:  Oxygen DME Agency:  [place]:    [facility] Agency:     Status of Service:  Completed, signed off  If discussed at [place] of Stay Meetings, dates discussed:    Additional Comments:  Leeroy Cha, RN 10/31/2018, 11:14 AM

## 2018-10-31 NOTE — Progress Notes (Signed)
SATURATION QUALIFICATIONS: (This note is used to comply with regulatory documentation for home oxygen)  Patient Saturations on Room Air at Rest = 87%  Patient Saturations on Room Air while Ambulating = 73%  Patient Saturations on 2 Liters of oxygen while Ambulating = 93  Please briefly explain why patient needs home oxygen:  Patient desaturates without O2 at rest and while walking.

## 2018-11-01 ENCOUNTER — Telehealth: Payer: Self-pay

## 2018-11-01 ENCOUNTER — Telehealth: Payer: Self-pay | Admitting: *Deleted

## 2018-11-01 ENCOUNTER — Other Ambulatory Visit: Payer: Self-pay | Admitting: Radiology

## 2018-11-01 NOTE — Telephone Encounter (Signed)
  Oncology Nurse Navigator Documentation   Called and spoke with patient's daughter, Leonia Heatherly,  to advise of med/onc initial consult with Dr. Luberta Robertson NP on 11/08/18 @ 9:45 AM. Durward Fortes to please arrive at 9:15 to register.

## 2018-11-01 NOTE — Telephone Encounter (Signed)
Transition Care Management Follow-up Telephone Call   Date discharged? 10/31/18   How have you been since you were released from the hospital? Pt states she is doing ok, but still have low grade fever   Do you understand why you were in the hospital? YES   Do you understand the discharge instructions? YES   Where were you discharged to? Home   Items Reviewed:  Medications reviewed: YES  Allergies reviewed: YES  Dietary changes reviewed: YES  Referrals reviewed: YES, pt has her biopsy schedule for tomorrow 11/02/18. Will be seeing oncologist on 11/08/18   Functional Questionnaire:   Activities of Daily Living (ADLs):   She states she are independent in the following: ambulation, bathing and hygiene, feeding, continence, grooming, toileting and dressing States she require assistance with the following: ambulation   Any transportation issues/concerns?: NO   Any patient concerns? NO   Confirmed importance and date/time of follow-up visits scheduled YES, 10/25/18  Provider Appointment booked with Dr. Sharlet Salina  Confirmed with patient if condition begins to worsen call PCP or go to the ER.  Patient was given the office number and encouraged to call back with question or concerns.  : YES

## 2018-11-02 ENCOUNTER — Other Ambulatory Visit (HOSPITAL_COMMUNITY): Payer: Self-pay | Admitting: Physician Assistant

## 2018-11-02 ENCOUNTER — Ambulatory Visit (HOSPITAL_COMMUNITY)
Admit: 2018-11-02 | Discharge: 2018-11-02 | Disposition: A | Discharge status: Home / Self Care | Payer: Medicare Other | Attending: Physician Assistant | Admitting: Physician Assistant

## 2018-11-02 ENCOUNTER — Encounter (HOSPITAL_COMMUNITY): Payer: Self-pay

## 2018-11-02 ENCOUNTER — Ambulatory Visit (HOSPITAL_COMMUNITY)
Admit: 2018-11-02 | Discharge: 2018-11-02 | Disposition: A | Discharge status: Home / Self Care | Payer: Medicare Other | Source: Ambulatory Visit | Attending: Physician Assistant | Admitting: Physician Assistant

## 2018-11-02 DIAGNOSIS — K219 Gastro-esophageal reflux disease without esophagitis: Secondary | ICD-10-CM | POA: Diagnosis not present

## 2018-11-02 DIAGNOSIS — Z79890 Hormone replacement therapy (postmenopausal): Secondary | ICD-10-CM | POA: Diagnosis not present

## 2018-11-02 DIAGNOSIS — R1011 Right upper quadrant pain: Secondary | ICD-10-CM | POA: Insufficient documentation

## 2018-11-02 DIAGNOSIS — R160 Hepatomegaly, not elsewhere classified: Secondary | ICD-10-CM | POA: Diagnosis present

## 2018-11-02 DIAGNOSIS — Z8249 Family history of ischemic heart disease and other diseases of the circulatory system: Secondary | ICD-10-CM | POA: Diagnosis not present

## 2018-11-02 DIAGNOSIS — Z79899 Other long term (current) drug therapy: Secondary | ICD-10-CM | POA: Insufficient documentation

## 2018-11-02 DIAGNOSIS — C259 Malignant neoplasm of pancreas, unspecified: Secondary | ICD-10-CM | POA: Insufficient documentation

## 2018-11-02 DIAGNOSIS — I10 Essential (primary) hypertension: Secondary | ICD-10-CM | POA: Insufficient documentation

## 2018-11-02 DIAGNOSIS — E079 Disorder of thyroid, unspecified: Secondary | ICD-10-CM | POA: Diagnosis not present

## 2018-11-02 DIAGNOSIS — Z96653 Presence of artificial knee joint, bilateral: Secondary | ICD-10-CM | POA: Insufficient documentation

## 2018-11-02 DIAGNOSIS — C787 Secondary malignant neoplasm of liver and intrahepatic bile duct: Secondary | ICD-10-CM | POA: Insufficient documentation

## 2018-11-02 DIAGNOSIS — M1990 Unspecified osteoarthritis, unspecified site: Secondary | ICD-10-CM | POA: Insufficient documentation

## 2018-11-02 DIAGNOSIS — Z888 Allergy status to other drugs, medicaments and biological substances status: Secondary | ICD-10-CM | POA: Diagnosis not present

## 2018-11-02 LAB — CBC WITH DIFFERENTIAL/PLATELET
ABS IMMATURE GRANULOCYTES: 0.43 10*3/uL — AB (ref 0.00–0.07)
BASOS PCT: 0 %
Basophils Absolute: 0.1 10*3/uL (ref 0.0–0.1)
Eosinophils Absolute: 0.3 10*3/uL (ref 0.0–0.5)
Eosinophils Relative: 1 %
HCT: 33.2 % — ABNORMAL LOW (ref 36.0–46.0)
Hemoglobin: 10 g/dL — ABNORMAL LOW (ref 12.0–15.0)
Immature Granulocytes: 2 %
Lymphocytes Relative: 6 %
Lymphs Abs: 1.6 10*3/uL (ref 0.7–4.0)
MCH: 27.5 pg (ref 26.0–34.0)
MCHC: 30.1 g/dL (ref 30.0–36.0)
MCV: 91.5 fL (ref 80.0–100.0)
Monocytes Absolute: 1.7 10*3/uL — ABNORMAL HIGH (ref 0.1–1.0)
Monocytes Relative: 6 %
Neutro Abs: 24.4 10*3/uL — ABNORMAL HIGH (ref 1.7–7.7)
Neutrophils Relative %: 85 %
Platelets: 465 10*3/uL — ABNORMAL HIGH (ref 150–400)
RBC: 3.63 MIL/uL — ABNORMAL LOW (ref 3.87–5.11)
RDW: 13.8 % (ref 11.5–15.5)
WBC: 28.6 10*3/uL — ABNORMAL HIGH (ref 4.0–10.5)
nRBC: 0 % (ref 0.0–0.2)

## 2018-11-02 LAB — COMPREHENSIVE METABOLIC PANEL
ALT: 22 U/L (ref 0–44)
AST: 31 U/L (ref 15–41)
Albumin: 2.8 g/dL — ABNORMAL LOW (ref 3.5–5.0)
Alkaline Phosphatase: 171 U/L — ABNORMAL HIGH (ref 38–126)
Anion gap: 11 (ref 5–15)
BUN: 13 mg/dL (ref 8–23)
CO2: 29 mmol/L (ref 22–32)
Calcium: 8.6 mg/dL — ABNORMAL LOW (ref 8.9–10.3)
Chloride: 96 mmol/L — ABNORMAL LOW (ref 98–111)
Creatinine, Ser: 0.82 mg/dL (ref 0.44–1.00)
GFR calc Af Amer: >60 mL/min (ref >60)
GFR calc non Af Amer: >60 mL/min (ref >60)
GLUCOSE: 125 mg/dL — AB (ref 70–99)
Potassium: 4.7 mmol/L (ref 3.5–5.1)
Sodium: 136 mmol/L (ref 135–145)
Total Bilirubin: 1 mg/dL (ref 0.3–1.2)
Total Protein: 6.9 g/dL (ref 6.5–8.1)

## 2018-11-02 LAB — CULTURE, BLOOD (ROUTINE X 2)
Culture: NO GROWTH
Culture: NO GROWTH
Special Requests: ADEQUATE
Special Requests: ADEQUATE

## 2018-11-02 LAB — PROTIME-INR
INR: 1.01
Prothrombin Time: 13.2 seconds (ref 11.4–15.2)

## 2018-11-02 MED ORDER — TRAMADOL HCL 50 MG PO TABS
50.0000 mg | ORAL_TABLET | Freq: Once | ORAL | Status: AC
  Administered 2018-11-02: 50 mg via ORAL
  Filled 2018-11-02: qty 1

## 2018-11-02 MED ORDER — MIDAZOLAM HCL 2 MG/2ML IJ SOLN
INTRAMUSCULAR | Status: AC | PRN
  Administered 2018-11-02: 1 mg via INTRAVENOUS

## 2018-11-02 MED ORDER — TRAMADOL HCL 50 MG PO TABS: 50.0000 mg | ORAL_TABLET | Freq: Four times a day (QID) | ORAL | 0 refills | Status: DC | PRN

## 2018-11-02 MED ORDER — SODIUM CHLORIDE 0.9 % IV SOLN
INTRAVENOUS | Status: DC
  Administered 2018-11-02: 12:00:00 via INTRAVENOUS

## 2018-11-02 MED ORDER — TRAMADOL HCL 50 MG PO TABS
ORAL_TABLET | ORAL | Status: AC
  Filled 2018-11-02: qty 1

## 2018-11-02 MED ORDER — FENTANYL CITRATE (PF) 100 MCG/2ML IJ SOLN
INTRAMUSCULAR | Status: AC | PRN
  Administered 2018-11-02: 50 ug via INTRAVENOUS

## 2018-11-02 MED ORDER — LIDOCAINE HCL 1 % IJ SOLN
INTRAMUSCULAR | Status: AC | PRN
  Administered 2018-11-02: 10 mL

## 2018-11-02 MED ORDER — FENTANYL CITRATE (PF) 100 MCG/2ML IJ SOLN
INTRAMUSCULAR | Status: AC
  Filled 2018-11-02: qty 2

## 2018-11-02 MED ORDER — MIDAZOLAM HCL 2 MG/2ML IJ SOLN
INTRAMUSCULAR | Status: AC
  Filled 2018-11-02: qty 4

## 2018-11-02 MED ORDER — GELATIN ABSORBABLE 12-7 MM EX MISC
CUTANEOUS | Status: AC
  Filled 2018-11-02: qty 1

## 2018-11-02 MED ORDER — LIDOCAINE HCL 1 % IJ SOLN
INTRAMUSCULAR | Status: AC
  Filled 2018-11-02: qty 20

## 2018-11-02 MED ORDER — FLUCONAZOLE 100 MG PO TABS: 100.0000 mg | ORAL_TABLET | Freq: Every day | ORAL | 0 refills | Status: AC

## 2018-11-02 NOTE — Procedures (Signed)
Interventional Radiology Procedure Note  Procedure: US guided liver mass biopsy.  Mx 18g core to the lab.  .  Complications: None Recommendations:  - Ok to shower tomorrow - Do not submerge for 7 days - Routine care   Signed,  Dulcy Fanny. Earleen Newport, DO

## 2018-11-02 NOTE — H&P (Signed)
Chief Complaint: Liver mass  Referring Physician(s): Bismar[place], Shrey  Supervising Physician: Corrie Mckusick  Patient Status: [facility] - Out-pt  History of Present Illness: Ashlee Weeks is a 84 y.o. female who was admitted to [facility] last week because of hypoxia and signs of sepsis.  Sepsis was ruled out/fever/elevated white blood cell count: As an outpatient patient has been having quite elevated white blood cell count and persistent fevers for several weeks.    She had CT imaging of the chest abdomen and pelvis that showed no infectious process but only metastatic likely prostate cancer.    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    She is here today for image guided biopsy of the liver lesion.  She is NPO. She is not feeling any better. She c/o pain in her right side which I believe is related to the metastatic disease. She rates it a 4/10 as long as she is not moving but 7 or 8/10 when she moves around.  She also c/o a vaginal yeast infection and feels she may also have thrush. She is requesting Diflucan.  No nausea/vomiting. No Fever/chills. Otherwise ROS negative.   Past Medical History:  Diagnosis Date  . Arthritis   . Blood in stool   . Diverticulitis   . GERD (gastroesophageal reflux disease)   . Hypertension   . Thyroid disease   . Urine incontinence     Past Surgical History:  Procedure Laterality Date  . APPENDECTOMY    . CHOLECYSTECTOMY    . EYE SURGERY Right 01/07/2016   artificial lens implant  . EYE SURGERY Left 01/23/2016   artificial lens implant  . REPLACEMENT TOTAL KNEE Bilateral   . THYROID SURGERY     1/2 of thyroid removed  . TONSILLECTOMY AND ADENOIDECTOMY    . TOTAL SHOULDER REPLACEMENT Left 2016  . TUBAL LIGATION      Allergies: Fosamax [alendronate sodium]  Medications: Prior to Admission medications   Medication Sig Start Date End Date Taking? Authorizing Provider  Calcium  Carbonate-Vitamin D (CALTRATE 600+D PO) Take 1 tablet by mouth every Monday, Wednesday, and Friday.     [provider]  Cholecalciferol (VITAMIN D-3) 1000 units CAPS Take 1,000 Units by mouth 2 (two) times daily. Take one by mouth twice daily     [provider]  cholestyramine light (PREVALITE) 4 g packet Take 1 packet (4 g total) by mouth 2 (two) times daily. Patient not taking: Reported on 10/27/2018 05/19/18   Hoyt Koch, MD  cyanocobalamin 1000 MCG tablet Take 1,000 mcg by mouth at bedtime.     [provider]  diltiazem (CARDIZEM CD) 180 MG 24 hr capsule Take 1 capsule (180 mg total) by mouth daily. 05/19/18   Hoyt Koch, MD  gabapentin (NEURONTIN) 300 MG capsule Take 1 capsule (300 mg total) by mouth at bedtime. 10/21/18   Lance Sell, NP  ibuprofen (ADVIL,MOTRIN) 200 MG tablet Take 400 mg by mouth daily as needed for moderate pain.     [provider]  levothyroxine (SYNTHROID, LEVOTHROID) 25 MCG tablet Take 1 tablet (25 mcg total) by mouth daily before breakfast. 05/19/18   Hoyt Koch, MD  metoprolol tartrate (LOPRESSOR) 50 MG tablet TAKE 1 TABLET BY MOUTH TWICE A DAY Patient taking differently: Take 50 mg by mouth 2 (two) times daily.  05/06/18   Hoyt Koch, MD  mirabegron ER (MYRBETRIQ) 50 MG TB24 tablet Take 1  tablet (50 mg total) by mouth daily. Patient not taking: Reported on 10/27/2018 05/19/18   Hoyt Koch, MD  Multiple Vitamins-Minerals (MULTIVITAMIN WITH MINERALS) tablet Take 1 tablet by mouth at bedtime.     [provider]  omeprazole (PRILOSEC) 20 MG capsule Take 1 capsule (20 mg total) by mouth daily. 05/19/18   Hoyt Koch, MD  raloxifene (EVISTA) 60 MG tablet TAKE 1 TABLET BY MOUTH EVERY DAY Patient taking differently: Take 60 mg by mouth at bedtime.  10/13/18   Hoyt Koch, MD  ramipril (ALTACE) 10 MG capsule TAKE 1 CAPSULE BY MOUTH 2 TIMES DAILY. Patient  taking differently: Take 10 mg by mouth 2 (two) times daily.  05/20/18   Hoyt Koch, MD     Family History  Problem Relation Age of Onset  . Hypertension Mother   . Heart disease Father   . Hypertension Maternal Aunt   . Miscarriages / Stillbirths Maternal Aunt   . Arthritis Maternal Grandmother   . Alcohol abuse Paternal Grandmother     Social History   Socioeconomic History  . Marital status: Widowed    Spouse name: Not on file  . Number of children: 1  . Years of education: Not on file  . Highest education level: Not on file  Occupational History  . Not on file  Social Needs  . Financial resource strain: Not hard at all  . Food insecurity:    Worry: Never true    Inability: Never true  . Transportation needs:    Medical: No    Non-medical: No  Tobacco Use  . Smoking status: Never Smoker  . Smokeless tobacco: Never Used  Substance and Sexual Activity  . Alcohol use: No    Alcohol/week: 0.0 standard drinks  . Drug use: No  . Sexual activity: Not Currently  Lifestyle  . Physical activity:    Days per week: 3 days    Minutes per session: 30 min  . Stress: Not at all  Relationships  . Social connections:    Talks on phone: More than three times a week    Gets together: More than three times a week    Attends religious service: More than 4 times per year    Active member of club or organization: Yes    Attends meetings of clubs or organizations: More than 4 times per year    Relationship status: Widowed  Other Topics Concern  . Not on file  Social History Narrative  . Not on file     Review of Systems: A 12 point ROS discussed and pertinent positives are indicated in the HPI above.  All other systems are negative.  Review of Systems  Vital Signs: BP (!) 120/57   Pulse 92   Temp 98.9 F (37.2 C) (Oral)   Resp 18   SpO2 90%   Physical Exam Vitals signs reviewed.  Constitutional:      Appearance: Normal appearance.     Comments: Moans in  pain with movement  HENT:     Head: Normocephalic and atraumatic.  Eyes:     Extraocular Movements: Extraocular movements intact.  Neck:     Musculoskeletal: Normal range of motion.  Cardiovascular:     Rate and Rhythm: Normal rate and regular rhythm.     Heart sounds: Normal heart sounds.  Pulmonary:     Effort: Pulmonary effort is normal.     Breath sounds: Normal breath sounds.  Abdominal:  Palpations: Abdomen is soft.     Tenderness: There is abdominal tenderness.     Comments: Tender RUQ  Musculoskeletal: Normal range of motion.  Skin:    General: Skin is warm and dry.  Neurological:     General: No focal deficit present.     Mental Status: She is alert and oriented to person, place, and time.  Psychiatric:        Mood and Affect: Mood normal.        Behavior: Behavior normal.        Thought Content: Thought content normal.        Judgment: Judgment normal.     Imaging: Dg Chest 2 View  Result Date: 10/27/2018 CLINICAL DATA:  Leukocytosis EXAM: CHEST - 2 VIEW COMPARISON:  10/21/2018 FINDINGS: Streaky atelectasis or scarring at the bases. No pleural effusion. Borderline cardiomegaly. No pneumothorax. Left shoulder replacement. IMPRESSION: Streaky bibasilar atelectasis or scarring Electronically Signed   By: Donavan Foil M.D.   On: 10/27/2018 20:59   Dg Chest 2 View  Result Date: 10/21/2018 CLINICAL DATA:  Fever.  Elevated white count EXAM: CHEST - 2 VIEW COMPARISON:  None. FINDINGS: Mild bibasilar airspace disease, probably atelectasis. No definite pneumonia. Negative for heart failure edema or effusion. Left shoulder placement IMPRESSION: Mild bibasilar airspace disease most likely atelectasis. Electronically Signed   By: Franchot Gallo M.D.   On: 10/21/2018 19:28   Ct Angio Chest Pe W Or Wo Contrast  Result Date: 10/28/2018 CLINICAL DATA:  New diagnosis of pancreatic cancer with metastatic disease to the liver yesterday. EXAM: CT ANGIOGRAPHY CHEST WITH CONTRAST  TECHNIQUE: Multidetector CT imaging of the chest was performed using the standard protocol during bolus administration of intravenous contrast. Multiplanar CT image reconstructions and MIPs were obtained to evaluate the vascular anatomy. CONTRAST:  125mL ISOVUE-370 IOPAMIDOL (ISOVUE-370) INJECTION 76% COMPARISON:  Chest radiography 10/27/2018 FINDINGS: Cardiovascular: Pulmonary arterial opacification is good. No visible pulmonary emboli. The study shows some motion degradation. There is aortic atherosclerosis without aneurysm or dissection. There is coronary artery calcification. Heart size is normal. Mediastinum/Nodes: No mediastinal or hilar mass or lymphadenopathy. Lungs/Pleura: There is mild bilateral lower lobe atelectasis. There is no pleural effusion. There is an 8 mm slightly spiculated nodule in the left upper lobe image 38. There is an 11 mm slightly spiculated nodule in the superior segment right lower lobe image 52. In this setting these are worrisome for pulmonary metastases. Upper Abdomen: See results of scan yesterday. Musculoskeletal: No lytic lesions seen. Ordinary spinal degenerative change. Review of the MIP images confirms the above findings. IMPRESSION: 1. No pulmonary emboli. 2. 8 mm slightly spiculated nodule in the left upper lobe. 11 mm slightly spiculated nodule in the superior segment right lower lobe. These are worrisome for pulmonary metastases. 3. Coronary artery calcification. Aortic Atherosclerosis (ICD10-I70.0). Electronically Signed   By: Nelson Chimes M.D.   On: 10/28/2018 19:01   Ct Abdomen Pelvis W Contrast  Result Date: 10/27/2018 CLINICAL DATA:  84 year old female with fever of unknown origin. Recent diagnosis of cystitis. EXAM: CT ABDOMEN AND PELVIS WITH CONTRAST TECHNIQUE: Multidetector CT imaging of the abdomen and pelvis was performed using the standard protocol following bolus administration of intravenous contrast. CONTRAST:  187mL ISOVUE-300 IOPAMIDOL (ISOVUE-300)  INJECTION 61% COMPARISON:  None. FINDINGS: Lower chest: There are bibasilar linear atelectasis/scarring. There is mild cardiomegaly. Multi vessel coronary vascular calcification. No intra-abdominal free air or free fluid. Hepatobiliary: Multiple (greater than 20) hypodense lesions throughout the liver measure up to  2.5 x 3.0 cm in the right lobe of the liver most consistent with metastatic disease. There is no intrahepatic biliary ductal dilatation. Cholecystectomy. Pancreas: There is a 3.2 x 3.9 x 4.2 cm complex hypodense/hypoenhancing mass in the distal body/tail of the pancreas most consistent with malignancy. This likely represents a primary neoplasm of the pancreas such as adenoma carcinoma versus less likely metastatic disease to the pancreas. There is atrophy of the pancreatic tail. There is a 2.0 x 2.0 cm rounded density adjacent to the tail of the pancreas in the region of the splenic hilum consistent with a satellite lesion/metastasis. Spleen: Normal in size without focal abnormality. Adrenals/Urinary Tract: There is a 9 mm left adrenal nodule, indeterminate. The right adrenal gland is unremarkable. The kidneys, and the visualized ureters appear unremarkable. The urinary bladder is collapsed. Stomach/Bowel: There is sigmoid diverticulosis without active inflammatory changes. There is a small hiatal hernia. A 2 cm duodenal diverticulum is also noted. There is no bowel obstruction or active inflammation. Appendectomy. Vascular/Lymphatic: There is moderate aortoiliac atherosclerotic disease. No portal venous gas. There is no adenopathy. Reproductive: The uterus and ovaries are grossly unremarkable. Other: None Musculoskeletal: There is multilevel degenerative changes of the spine. There is a transitional vertebra. There is grade 1 L5-S1 anterolisthesis (anterolisthesis of the disc space superior to the transitional vertebra). No acute osseous pathology. IMPRESSION: 1. Complex mass in the distal body/tail of  the pancreas most consistent with pancreatic malignancy. There is a satellite lesion/metastasis adjacent to the tail of the pancreas. Further evaluation with pancreatic protocol MRI on a nonemergent basis recommended. 2. Hepatic metastatic disease. 3. Indeterminate left adrenal nodule. 4. Sigmoid diverticulosis. No bowel obstruction or active inflammation. Electronically Signed   By: Anner Crete M.D.   On: 10/27/2018 21:01   Dg Chest Port 1 View  Result Date: 10/31/2018 CLINICAL DATA:  84 year old female with hypoxia. Shortness breath. Hypertension. Subsequent encounter. EXAM: PORTABLE CHEST 1 VIEW COMPARISON:  10/30/2018 chest x-ray.  10/28/2018 chest CT. FINDINGS: Cardiomegaly. Elevated right hemidiaphragm. Bibasilar atelectasis suspected. Limited for evaluating for lower lobe infiltrate or pleural effusion. CT detected left upper lobe and superior segment right lower lobe 8 mm and 11 mm spiculated nodules not well delineated by plain film exam. Calcified aorta. Post left shoulder replacement. IMPRESSION: 1. Cardiomegaly. 2. Elevated right hemidiaphragm. 3. Bibasilar atelectasis suspected. Limited for evaluating for lower lobe infiltrate or pleural effusion. 4. CT detected left upper lobe and superior segment right lower lobe 8 mm and 11 mm spiculated nodules not well delineated by plain film exam. Electronically Signed   By: Genia Del M.D.   On: 10/31/2018 07:49   Dg Chest Port 1 View  Result Date: 10/30/2018 CLINICAL DATA:  Impaired oxygenation. EXAM: PORTABLE CHEST 1 VIEW COMPARISON:  10/28/2018 FINDINGS: Normal heart size. Aortic atherosclerosis. There is mild bibasilar atelectasis. No airspace consolidation. Previous reverse left shoulder arthroplasty. IMPRESSION: 1. Bibasilar atelectasis. 2.  Aortic Atherosclerosis (ICD10-I70.0). Electronically Signed   By: Kerby Moors M.D.   On: 10/30/2018 07:27   Vas [place] Lower Extremity Venous (dvt)  Result Date: 10/29/2018  Lower Venous Study  Indications: Edema. Other Indications: Suspected Pancreatic cancer with metastasis. Limitations: Body habitus. Comparison Study: No comparison study available. Performing Technologist: Rudell Cobb  Examination Guidelines: A complete evaluation includes B-mode imaging, spectral Doppler, color Doppler, and power Doppler as needed of all accessible portions of each vessel. Bilateral testing is considered an integral part of a complete examination. Limited examinations for reoccurring indications may be performed  as noted.  Right Venous Findings: +---------+---------------+---------+-----------+----------+-------+          CompressibilityPhasicitySpontaneityPropertiesSummary +---------+---------------+---------+-----------+----------+-------+ CFV      Full           Yes      Yes                          +---------+---------------+---------+-----------+----------+-------+ SFJ      Full                                                 +---------+---------------+---------+-----------+----------+-------+ FV Prox  Full                                                 +---------+---------------+---------+-----------+----------+-------+ FV Mid   Full                                                 +---------+---------------+---------+-----------+----------+-------+ FV DistalFull                                                 +---------+---------------+---------+-----------+----------+-------+ PFV      Full                                                 +---------+---------------+---------+-----------+----------+-------+ POP      Full           Yes      Yes                          +---------+---------------+---------+-----------+----------+-------+ PTV      Full                                                 +---------+---------------+---------+-----------+----------+-------+ PERO     Full                                                  +---------+---------------+---------+-----------+----------+-------+  Left Venous Findings: +---------+---------------+---------+-----------+----------+-------+          CompressibilityPhasicitySpontaneityPropertiesSummary +---------+---------------+---------+-----------+----------+-------+ CFV      Full           Yes      Yes                          +---------+---------------+---------+-----------+----------+-------+ SFJ      Full                                                 +---------+---------------+---------+-----------+----------+-------+  FV Prox  Full                                                 +---------+---------------+---------+-----------+----------+-------+ FV Mid   Full                                                 +---------+---------------+---------+-----------+----------+-------+ FV DistalFull                                                 +---------+---------------+---------+-----------+----------+-------+ PFV      Full                                                 +---------+---------------+---------+-----------+----------+-------+ POP      Full           Yes      Yes                          +---------+---------------+---------+-----------+----------+-------+ PTV      Full                                                 +---------+---------------+---------+-----------+----------+-------+ PERO     Full                                                 +---------+---------------+---------+-----------+----------+-------+    Summary: Right: There is no evidence of deep vein thrombosis in the lower extremity. No cystic structure found in the popliteal fossa. Left: There is no evidence of deep vein thrombosis in the lower extremity. No cystic structure found in the popliteal fossa.  *See table(s) above for measurements and observations. Electronically signed by Servando Snare MD on 10/29/2018 at 1:30:41 PM.    Final      Labs:  CBC: Recent Labs    10/28/18 0113 10/29/18 0410 10/30/18 0407 10/31/18 0356  WBC 23.4* 25.3* 23.6* 24.0*  HGB 9.9* 9.4* 9.2* 9.1*  HCT 32.5* 31.4* 30.7* 30.1*  PLT 412* PLATELET CLUMPS NOTED ON SMEAR, COUNT APPEARS INCREASED 417* 371    COAGS: No results for input(s): INR, APTT in the last 8760 hours.  BMP: Recent Labs    05/19/18 1442 10/21/18 1837 10/27/18 1823 10/28/18 0113 10/29/18 0410  NA 142 135 136  --  137  K 3.9 4.4 3.8  --  4.1  CL 106 97* 99  --  102  CO2 28 25 27   --  27  GLUCOSE 133* 125* 166*  --  134*  BUN 16 12 10   --  7*  CALCIUM 9.6 8.6* 8.5*  --  8.5*  CREATININE 0.72 0.88 0.79 0.83 0.67  GFRNONAA  --  >  60 >60 >60 >60  GFRAA  --  >60 >60 >60 >60    LIVER FUNCTION TESTS: Recent Labs    05/19/18 1442 10/21/18 1837 10/27/18 1823 10/29/18 0410  BILITOT 0.4 0.8 0.4 0.3  AST 44* 29 28 20   ALT 31 18 25 20   ALKPHOS 57 116 135* 129*  PROT 6.7 7.1 6.8 6.0*  ALBUMIN 4.2 3.3* 2.9* 2.5*    TUMOR MARKERS: No results for input(s): AFPTM, CEA, CA199, CHROMGRNA in the last 8760 hours.  Assessment and Plan:  Liver mass likely metastatic pancreatic cancer.  RUQ pain likely secondary to liver mets.  Will proceed with biopsy today by Dr. Earleen Newport.  Risks and benefits discussed with the patient including, but not limited to bleeding, infection, damage to adjacent structures or low yield requiring additional tests.  All of the patient's questions were answered, patient is agreeable to proceed. Consent signed and in chart.  Will e-prescribe Diflucan at patient's request and call in Tramadol for pain. She understands when she sees her Oncologist, he/she will likely prescribe something for pain.  Thank you for this interesting consult.  I greatly enjoyed meeting Doniesha Landau and look forward to participating in their care.  A copy of this report was sent to the requesting provider on this date.  Electronically Signed: Murrell Redden,  PA-C   11/02/2018, 12:06 PM      I spent a total of  30 Minutes in face to face in clinical consultation, greater than 50% of which was counseling/coordinating care for liver mass biopsy.

## 2018-11-02 NOTE — Discharge Instructions (Signed)
Moderate Conscious Sedation, Adult, Care After  These instructions provide you with information about caring for yourself after your procedure. Your health care provider may also give you more specific instructions. Your treatment has been planned according to current medical practices, but problems sometimes occur. Call your health care provider if you have any problems or questions after your procedure.  What can I expect after the procedure?  After your procedure, it is common:  · To feel sleepy for several hours.  · To feel clumsy and have poor balance for several hours.  · To have poor judgment for several hours.  · To vomit if you eat too soon.  Follow these instructions at home:  For at least 24 hours after the procedure:    · Do not:  ? Participate in activities where you could fall or become injured.  ? Drive.  ? Use heavy machinery.  ? Drink alcohol.  ? Take sleeping pills or medicines that cause drowsiness.  ? Make important decisions or sign legal documents.  ? Take care of children on your own.  · Rest.  Eating and drinking  · Follow the diet recommended by your health care provider.  · If you vomit:  ? Drink water, juice, or soup when you can drink without vomiting.  ? Make sure you have little or no nausea before eating solid foods.  General instructions  · Have a responsible adult stay with you until you are awake and alert.  · Take over-the-counter and prescription medicines only as told by your health care provider.  · If you smoke, do not smoke without supervision.  · Keep all follow-up visits as told by your health care provider. This is important.  Contact a health care provider if:  · You keep feeling nauseous or you keep vomiting.  · You feel light-headed.  · You develop a rash.  · You have a fever.  Get help right away if:  · You have trouble breathing.  This information is not intended to replace advice given to you by your health care provider. Make sure you discuss any questions you have  with your health care provider.  Document Released: 07/12/2013 Document Revised: 02/24/2016 Document Reviewed: 01/11/2016  Elsevier Interactive Patient Education © 2019 [employer]  Liver Biopsy, Care After  These instructions give you information on caring for yourself after your procedure. Your doctor may also give you more specific instructions. Call your doctor if you have any problems or questions after your procedure.  What can I expect after the procedure?  After the procedure, it is common to have:  · Pain and soreness where the biopsy was done.  · Bruising around the area where the biopsy was done.  · Sleepiness and be tired for a few days.  Follow these instructions at home:  Medicines  · Take over-the-counter and prescription medicines only as told by your doctor.  · If you were prescribed an antibiotic medicine, take it as told by your doctor. Do not stop taking the antibiotic even if you start to feel better.  · Do not take medicines such as aspirin and ibuprofen. These medicines can thin your blood. Do not take these medicines unless your doctor tells you to take them.  · If you are taking prescription pain medicine, take actions to prevent or treat constipation. Your doctor may recommend that you:  ? Drink enough fluid to keep your pee (urine) clear or pale yellow.  ? Take over-the-counter or   prescription medicines.  ? Eat foods that are high in fiber, such as fresh fruits and vegetables, whole grains, and beans.  ? Limit foods that are high in fat and processed sugars, such as fried and sweet foods.  Caring for your cut  · Follow instructions from your doctor about how to take care of your cuts from surgery (incisions). Make sure you:  ? Wash your hands with soap and water before you change your bandage (dressing). If you cannot use soap and water, use hand sanitizer.  ? Change your bandage as told by your doctor.  ? Leave stitches (sutures), skin glue, or skin tape (adhesive) strips in place. They  may need to stay in place for 2 weeks or longer. If tape strips get loose and curl up, you may trim the loose edges. Do not remove tape strips completely unless your doctor says it is okay.  · Check your cuts every day for signs of infection. Check for:  ? Redness, swelling, or more pain.  ? Fluid or blood.  ? Pus or a bad smell.  ? Warmth.  · Do not take baths, swim, or use a hot tub until your doctor says it is okay to do so.  Activity    · Rest at home for 1-2 days or as told by your doctor.  ? Avoid sitting for a long time without moving. Get up to take short walks every 1-2 hours.  · Return to your normal activities as told by your doctor. Ask what activities are safe for you.  · Do not do these things in the first 24 hours:  ? Drive.  ? Use machinery.  ? Take a bath or shower.  · Do not lift more than 10 pounds (4.5 kg) or play contact sports for the first 2 weeks.  General instructions    · Do not drink alcohol in the first week after the procedure.  · Have someone stay with you for at least 24 hours after the procedure.  · Get your test results. Ask your doctor or the department that is doing the test:  ? When will my results be ready?  ? How will I get my results?  ? What are my treatment options?  ? What other tests do I need?  ? What are my next steps?  · Keep all follow-up visits as told by your doctor. This is important.  Contact a doctor if:  · A cut bleeds and leaves more than just a small spot of blood.  · A cut is red, puffs up (swells), or hurts more than before.  · Fluid or something else comes from a cut.  · A cut smells bad.  · You have a fever or chills.  Get help right away if:  · You have swelling, bloating, or pain in your belly (abdomen).  · You get dizzy or faint.  · You have a rash.  · You feel sick to your stomach (nauseous) or throw up (vomit).  · You have trouble breathing, feel short of breath, or feel faint.  · Your chest hurts.  · You have problems talking or seeing.  · You have  trouble with your balance or moving your arms or legs.  Summary  · After the procedure, it is common to have pain, soreness, bruising, and tiredness.  · Your doctor will tell you how to take care of yourself at home. Change your bandage, take your medicines, and limit your activities   as told by your doctor.  · Call your doctor if you have symptoms of infection. Get help right away if your belly swells, your cut bleeds a lot, or you have trouble talking or breathing.  This information is not intended to replace advice given to you by your health care provider. Make sure you discuss any questions you have with your health care provider.  Document Released: 06/30/2008 Document Revised: 10/01/2017 Document Reviewed: 10/01/2017  Elsevier Interactive Patient Education © 2019 [employer]

## 2018-11-03 ENCOUNTER — Ambulatory Visit: Payer: Medicare Other | Admitting: Internal Medicine

## 2018-11-04 ENCOUNTER — Encounter: Payer: Self-pay | Admitting: Internal Medicine

## 2018-11-04 ENCOUNTER — Ambulatory Visit: Payer: Medicare Other | Admitting: Internal Medicine

## 2018-11-04 ENCOUNTER — Other Ambulatory Visit: Payer: Self-pay | Admitting: Internal Medicine

## 2018-11-04 DIAGNOSIS — J9601 Acute respiratory failure with hypoxia: Secondary | ICD-10-CM | POA: Diagnosis not present

## 2018-11-04 DIAGNOSIS — I10 Essential (primary) hypertension: Secondary | ICD-10-CM

## 2018-11-04 DIAGNOSIS — C787 Secondary malignant neoplasm of liver and intrahepatic bile duct: Secondary | ICD-10-CM

## 2018-11-04 DIAGNOSIS — C259 Malignant neoplasm of pancreas, unspecified: Secondary | ICD-10-CM | POA: Diagnosis not present

## 2018-11-04 MED ORDER — LIDOCAINE 5 % EX PTCH: 1.0000 | MEDICATED_PATCH | CUTANEOUS | 6 refills | Status: AC

## 2018-11-04 MED ORDER — DICLOFENAC SODIUM 1 % TD GEL: 4.0000 g | Freq: Four times a day (QID) | TRANSDERMAL | 6 refills | Status: AC

## 2018-11-04 NOTE — Assessment & Plan Note (Signed)
O2 forms filled out for coverage. She is 87% off oxygen at rest and improves to 92 with 2L O2. She is not very active currently but can get around house slowly.

## 2018-11-04 NOTE — Progress Notes (Signed)
   Subjective:   Patient ID: Ashlee Weeks, female    DOB: [birth year], 84 y.o.   MRN: [id number]  HPI The patient is an 84 YO female coming in for hospital follow up (in for high white count and feeling very ill with chills, diagnosed with likely metastatic pancreatic cancer, needed O2 and discharged with home oxygen). She had liver biopsy earlier this week and will be meeting with oncology next week. She is having some pain at the biopsy site. Not as painful without movement. She is able to do all ADLs currently but cannot move around for long distances. She denies SOB but mild cough. She denies nausea or vomiting. Appetite is very poor and not eating anything except liquids and smoothies since leaving hospital. Last BM last week. Denies abdominal pain. Passing gas still. Son present with her at visit.   PMH, [facility], social history reviewed and updated.   Review of Systems  Constitutional: Positive for activity change, appetite change, fatigue and fever.  HENT: Negative.   Eyes: Negative.   Respiratory: Positive for chest tightness. Negative for cough and shortness of breath.   Cardiovascular: Positive for chest pain. Negative for palpitations and leg swelling.  Gastrointestinal: Positive for abdominal pain. Negative for abdominal distention, constipation, diarrhea, nausea and vomiting.  Musculoskeletal: Positive for myalgias.  Skin: Negative.   Neurological: Positive for weakness.  Psychiatric/Behavioral: Negative.     Objective:  Physical Exam Constitutional:      Appearance: She is well-developed. She is ill-appearing.  HENT:     Head: Normocephalic and atraumatic.  Neck:     Musculoskeletal: Normal range of motion.  Cardiovascular:     Rate and Rhythm: Normal rate and regular rhythm.     Comments: Pain right chest wall inferior rib cage and upper abdomen flank at site of liver biopsy Pulmonary:     Effort: Pulmonary effort is normal. No respiratory distress.     Breath sounds:  Normal breath sounds. No wheezing or rales.  Abdominal:     General: Bowel sounds are normal. There is no distension.     Palpations: Abdomen is soft.     Tenderness: There is no abdominal tenderness. There is no rebound.  Skin:    General: Skin is warm and dry.  Neurological:     Mental Status: She is alert and oriented to person, place, and time.     Coordination: Coordination abnormal.     Comments: wheelchair     Vitals:   11/04/18 1357  BP: 120/80  Pulse: 94  Temp: 98.6 F (37 C)  TempSrc: Oral  SpO2: (!) 87%  Height: 5\' 3"  (1.6 m)    Assessment & Plan:

## 2018-11-04 NOTE — Patient Instructions (Signed)
We have sent in the lidocaine patches to use for pain.   We have also sent in the voltaren gel to use for pain up to 4 times per day.

## 2018-11-04 NOTE — Assessment & Plan Note (Signed)
Biopsy of liver is compatible with pancreatic in origin but undifferentiated. She will confer with oncology next week for plan. We did discuss quality versus quantity of life and she would like to prioritize quality of life if this cancer is not curable. Rx for lidoderm and voltaren for pain on the right side at liver biopsy location. She is using tramadol which is not very effective and can continue if needed. Making her sleepy so do not want to increase if able to try alternatives.

## 2018-11-07 NOTE — Progress Notes (Addendum)
New Hematology/Oncology Consult   Requesting MD: Dr. Thomes Dinning  Reason for Consult: Metastatic pancreas cancer  HPI: Ashlee Weeks is an 84 year old woman who was referred to the emergency department 10/27/2018 with abnormal labs, specifically an elevated white blood cell count.  Chest x-ray showed streaky bibasilar atelectasis or scarring.  CT abdomen/pelvis showed a 3.2 x 3.9 x 4.2 cm complex hypodense/hypoenhancing mass in the distal body/tail of the pancreas.  There was atrophy of the pancreatic tail.  There was a 2.0 x 2.0 cm rounded density adjacent to the tail of the pancreas in the region of the splenic hilum consistent with a satellite lesion/metastasis; multiple (greater than 20) hypodense lesions throughout the liver measuring up to 2.5 x 3.0 cm in the right lobe of the liver most consistent with metastatic disease; 9 mm indeterminate left adrenal nodule.  CT chest on 10/28/2026 showed an 8 mm slightly spiculated nodule in the left upper lobe, an 11 mm slightly spiculated nodule in superior segment right lower lobe.  She was discharged home 10/31/2018 with arrangements made for an outpatient biopsy 11/02/2018.  She underwent biopsy of a right liver lesion 11/02/2018.  Final pathology showed poorly differentiated carcinoma; strong positivity with cytokeratin AE1/AE3 and cytokeratin 7; negative for cytokeratin 20, cytokeratin 5/6, CDX2, Napsin A, TTF-1, ER, PR, GATA3, GCDFP, MOC-31, WT-1, desmin, smooth muscle actin, melan-A, S0X-10 and S100.     Past Medical History:  Diagnosis Date  . Arthritis   . Blood in stool   . Diverticulitis   . GERD (gastroesophageal reflux disease)   . Hypertension   . Thyroid disease   . Urine incontinence   :   Past Surgical History:  Procedure Laterality Date  . APPENDECTOMY    . CHOLECYSTECTOMY    . EYE SURGERY Right 01/07/2016   artificial lens implant  . EYE SURGERY Left 01/23/2016   artificial lens implant  . REPLACEMENT TOTAL KNEE Bilateral    . THYROID SURGERY     1/2 of thyroid removed  . TONSILLECTOMY AND ADENOIDECTOMY    . TOTAL SHOULDER REPLACEMENT Left 2016  . TUBAL LIGATION    :   Current Outpatient Medications:  .  Calcium Carbonate-Vitamin D (CALTRATE 600+D PO), Take 1 tablet by mouth every Monday, Wednesday, and Friday. , Disp: , Rfl:  .  Cholecalciferol (VITAMIN D-3) 1000 units CAPS, Take 1,000 Units by mouth 2 (two) times daily. Take one by mouth twice daily , Disp: , Rfl:  .  cyanocobalamin 1000 MCG tablet, Take 1,000 mcg by mouth at bedtime. , Disp: , Rfl:  .  diclofenac sodium (VOLTAREN) 1 % GEL, Apply 4 g topically 4 (four) times daily., Disp: 100 g, Rfl: 6 .  diltiazem (CARDIZEM CD) 180 MG 24 hr capsule, Take 1 capsule (180 mg total) by mouth daily., Disp: 30 capsule, Rfl: 6 .  ibuprofen (ADVIL,MOTRIN) 200 MG tablet, Take 400 mg by mouth daily as needed for moderate pain. , Disp: , Rfl:  .  levothyroxine (SYNTHROID, LEVOTHROID) 25 MCG tablet, Take 1 tablet (25 mcg total) by mouth daily before breakfast., Disp: 90 tablet, Rfl: 3 .  lidocaine (LIDODERM) 5 %, Place 1 patch onto the skin daily. Remove & Discard patch within 12 hours or as directed by MD, Disp: 30 patch, Rfl: 6 .  metoprolol tartrate (LOPRESSOR) 50 MG tablet, Take 1 tablet (50 mg total) by mouth 2 (two) times daily. -- Office visit needed for further refills, Disp: 180 tablet, Rfl: 0 .  Multiple Vitamins-Minerals (MULTIVITAMIN  WITH MINERALS) tablet, Take 1 tablet by mouth at bedtime. , Disp: , Rfl:  .  omeprazole (PRILOSEC) 20 MG capsule, Take 1 capsule (20 mg total) by mouth daily., Disp: 90 capsule, Rfl: 3 .  raloxifene (EVISTA) 60 MG tablet, TAKE 1 TABLET BY MOUTH EVERY DAY (Patient taking differently: Take 60 mg by mouth at bedtime. ), Disp: 90 tablet, Rfl: 2 .  ramipril (ALTACE) 10 MG capsule, TAKE 1 CAPSULE BY MOUTH 2 TIMES DAILY. (Patient taking differently: Take 10 mg by mouth 2 (two) times daily. ), Disp: 180 capsule, Rfl: 1 .  traMADol  (ULTRAM) 50 MG tablet, Take 1 tablet (50 mg total) by mouth every 6 (six) hours as needed for up to 10 days., Disp: 30 tablet, Rfl: 0:  :   Allergies  Allergen Reactions  . Fosamax [Alendronate Sodium] Other (See Comments)    Joint Swelling  :  FH: Mother deceased age 69 with Parkinson's; father deceased age 70 with heart problems; she has a sister and 2 brothers.  One brother had a benign tumor removed from his gallbladder.  SOCIAL HISTORY: She lives in [place].  She is a widow.  Her daughter is currently living with her.  She has a son that lives in [place].  She is a retired high school Psychologist, prison and probation services.  Remote tobacco use.  Rare alcohol intake.  Review of Systems: Thanksgiving 2019 she was diagnosed with shingles.  She began experiencing chills, night sweats, fever and fatigue around that time as well.  The fever tends to happen at nighttime approximately 100 degrees.  She spends the majority of the day in bed or in a chair.  She is having right abdomen and right shoulder pain.  She is taking Ultram every 6 hours with temporary relief.  The Ultram makes her "sleepy".  No bleeding.  Appetite is poor.  She is losing weight.  She has dyspnea.  She is on oxygen at home, 2 L.  No significant cough.  No significant nausea.  No dysphagia.  Some constipation.  She has not started a laxative.  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  No dysuria.  No neuropathy symptoms.  Physical Exam:  Blood pressure (!) 121/52, pulse 92, temperature 98.9 F (37.2 C), temperature source Oral, resp. rate 19, height 5\' 3"  (1.6 m), weight 167 lb 9.6 oz (76 kg), SpO2 95 %.  HEENT: Pupils equal round and reactive to light.  Extraocular movements intact.  Sclera anicteric.  Oropharynx is without thrush or ulceration. Lungs: Lungs with faint rales at the bases.  No respiratory distress. Cardiac: Regular rate and rhythm. Abdomen: Tender at the right upper abdomen.  Vascular: No leg edema. Lymph  nodes: Palpable cervical, supraclavicular, axillary or inguinal lymph nodes. Neurologic: Mild lethargy.  Answers questions appropriately.  Follows commands.  LABS: None  RADIOLOGY:  Dg Chest 2 View  Result Date: 10/27/2018 CLINICAL DATA:  Leukocytosis EXAM: CHEST - 2 VIEW COMPARISON:  10/21/2018 FINDINGS: Streaky atelectasis or scarring at the bases. No pleural effusion. Borderline cardiomegaly. No pneumothorax. Left shoulder replacement. IMPRESSION: Streaky bibasilar atelectasis or scarring Electronically Signed   By: Donavan Foil M.D.   On: 10/27/2018 20:59   Dg Chest 2 View  Result Date: 10/21/2018 CLINICAL DATA:  Fever.  Elevated white count EXAM: CHEST - 2 VIEW COMPARISON:  None. FINDINGS: Mild bibasilar airspace disease, probably atelectasis. No definite pneumonia. Negative for heart failure edema or effusion. Left shoulder placement IMPRESSION: Mild bibasilar  airspace disease most likely atelectasis. Electronically Signed   By: Franchot Gallo M.D.   On: 10/21/2018 19:28   Ct Angio Chest Pe W Or Wo Contrast  Result Date: 10/28/2018 CLINICAL DATA:  New diagnosis of pancreatic cancer with metastatic disease to the liver yesterday. EXAM: CT ANGIOGRAPHY CHEST WITH CONTRAST TECHNIQUE: Multidetector CT imaging of the chest was performed using the standard protocol during bolus administration of intravenous contrast. Multiplanar CT image reconstructions and MIPs were obtained to evaluate the vascular anatomy. CONTRAST:  125mL ISOVUE-370 IOPAMIDOL (ISOVUE-370) INJECTION 76% COMPARISON:  Chest radiography 10/27/2018 FINDINGS: Cardiovascular: Pulmonary arterial opacification is good. No visible pulmonary emboli. The study shows some motion degradation. There is aortic atherosclerosis without aneurysm or dissection. There is coronary artery calcification. Heart size is normal. Mediastinum/Nodes: No mediastinal or hilar mass or lymphadenopathy. Lungs/Pleura: There is mild bilateral lower lobe  atelectasis. There is no pleural effusion. There is an 8 mm slightly spiculated nodule in the left upper lobe image 38. There is an 11 mm slightly spiculated nodule in the superior segment right lower lobe image 52. In this setting these are worrisome for pulmonary metastases. Upper Abdomen: See results of scan yesterday. Musculoskeletal: No lytic lesions seen. Ordinary spinal degenerative change. Review of the MIP images confirms the above findings. IMPRESSION: 1. No pulmonary emboli. 2. 8 mm slightly spiculated nodule in the left upper lobe. 11 mm slightly spiculated nodule in the superior segment right lower lobe. These are worrisome for pulmonary metastases. 3. Coronary artery calcification. Aortic Atherosclerosis (ICD10-I70.0). Electronically Signed   By: Nelson Chimes M.D.   On: 10/28/2018 19:01   Ct Abdomen Pelvis W Contrast  Result Date: 10/27/2018 CLINICAL DATA:  84 year old female with fever of unknown origin. Recent diagnosis of cystitis. EXAM: CT ABDOMEN AND PELVIS WITH CONTRAST TECHNIQUE: Multidetector CT imaging of the abdomen and pelvis was performed using the standard protocol following bolus administration of intravenous contrast. CONTRAST:  127mL ISOVUE-300 IOPAMIDOL (ISOVUE-300) INJECTION 61% COMPARISON:  None. FINDINGS: Lower chest: There are bibasilar linear atelectasis/scarring. There is mild cardiomegaly. Multi vessel coronary vascular calcification. No intra-abdominal free air or free fluid. Hepatobiliary: Multiple (greater than 20) hypodense lesions throughout the liver measure up to 2.5 x 3.0 cm in the right lobe of the liver most consistent with metastatic disease. There is no intrahepatic biliary ductal dilatation. Cholecystectomy. Pancreas: There is a 3.2 x 3.9 x 4.2 cm complex hypodense/hypoenhancing mass in the distal body/tail of the pancreas most consistent with malignancy. This likely represents a primary neoplasm of the pancreas such as adenoma carcinoma versus less likely  metastatic disease to the pancreas. There is atrophy of the pancreatic tail. There is a 2.0 x 2.0 cm rounded density adjacent to the tail of the pancreas in the region of the splenic hilum consistent with a satellite lesion/metastasis. Spleen: Normal in size without focal abnormality. Adrenals/Urinary Tract: There is a 9 mm left adrenal nodule, indeterminate. The right adrenal gland is unremarkable. The kidneys, and the visualized ureters appear unremarkable. The urinary bladder is collapsed. Stomach/Bowel: There is sigmoid diverticulosis without active inflammatory changes. There is a small hiatal hernia. A 2 cm duodenal diverticulum is also noted. There is no bowel obstruction or active inflammation. Appendectomy. Vascular/Lymphatic: There is moderate aortoiliac atherosclerotic disease. No portal venous gas. There is no adenopathy. Reproductive: The uterus and ovaries are grossly unremarkable. Other: None Musculoskeletal: There is multilevel degenerative changes of the spine. There is a transitional vertebra. There is grade 1 L5-S1 anterolisthesis (anterolisthesis of the disc  space superior to the transitional vertebra). No acute osseous pathology. IMPRESSION: 1. Complex mass in the distal body/tail of the pancreas most consistent with pancreatic malignancy. There is a satellite lesion/metastasis adjacent to the tail of the pancreas. Further evaluation with pancreatic protocol MRI on a nonemergent basis recommended. 2. Hepatic metastatic disease. 3. Indeterminate left adrenal nodule. 4. Sigmoid diverticulosis. No bowel obstruction or active inflammation. Electronically Signed   By: Anner Crete M.D.   On: 10/27/2018 21:01   US Biopsy (liver)  Result Date: 11/02/2018 INDICATION: 84 year old female with a history of pancreatic cancer, with metastasis to liver. EXAM: ULTRASOUND-GUIDED BIOPSY LIVER MASS MEDICATIONS: None. ANESTHESIA/SEDATION: Moderate (conscious) sedation was employed during this procedure. A  total of Versed 1.0 mg and Fentanyl 50 mcg was administered intravenously. Moderate Sedation Time: 10 minutes. The patient's level of consciousness and vital signs were monitored continuously by radiology nursing throughout the procedure under my direct supervision. FLUOROSCOPY TIME:  None COMPLICATIONS: None PROCEDURE: Informed written consent was obtained from the patient after a thorough discussion of the procedural risks, benefits and alternatives. All questions were addressed. Maximal Sterile Barrier Technique was utilized including caps, mask, sterile gowns, sterile gloves, sterile drape, hand hygiene and skin antiseptic. A timeout was performed prior to the initiation of the procedure. Patient positioned supine position on the ultrasound table. Images of the liver were stored and sent to PACs. The patient is prepped and draped in the usual sterile fashion. 1% lidocaine was used for local anesthesia. Using ultrasound guidance, multiple 18 gauge core biopsy were acquired via a 17 gauge guide needle of right liver lesion. Tissue specimen sent to pathology for complete histopathologic analysis. Gel-Foam pledgets were infused. Needle was removed and a final image was stored. Patient tolerated the procedure well and remained hemodynamically stable throughout. No complications were encountered and no significant blood loss IMPRESSION: Status post ultrasound-guided biopsy of liver mass. Tissue specimen sent to pathology for complete histopathologic analysis. Signed, Dulcy Fanny. Dellia Nims, RPVI [facility] Electronically Signed   By: Corrie Mckusick D.O.   On: 11/02/2018 14:24   Dg Chest Port 1 View  Result Date: 10/31/2018 CLINICAL DATA:  84 year old female with hypoxia. Shortness breath. Hypertension. Subsequent encounter. EXAM: PORTABLE CHEST 1 VIEW COMPARISON:  10/30/2018 chest x-ray.  10/28/2018 chest CT. FINDINGS: Cardiomegaly. Elevated right  hemidiaphragm. Bibasilar atelectasis suspected. Limited for evaluating for lower lobe infiltrate or pleural effusion. CT detected left upper lobe and superior segment right lower lobe 8 mm and 11 mm spiculated nodules not well delineated by plain film exam. Calcified aorta. Post left shoulder replacement. IMPRESSION: 1. Cardiomegaly. 2. Elevated right hemidiaphragm. 3. Bibasilar atelectasis suspected. Limited for evaluating for lower lobe infiltrate or pleural effusion. 4. CT detected left upper lobe and superior segment right lower lobe 8 mm and 11 mm spiculated nodules not well delineated by plain film exam. Electronically Signed   By: Genia Del M.D.   On: 10/31/2018 07:49   Dg Chest Port 1 View  Result Date: 10/30/2018 CLINICAL DATA:  Impaired oxygenation. EXAM: PORTABLE CHEST 1 VIEW COMPARISON:  10/28/2018 FINDINGS: Normal heart size. Aortic atherosclerosis. There is mild bibasilar atelectasis. No airspace consolidation. Previous reverse left shoulder arthroplasty. IMPRESSION: 1. Bibasilar atelectasis. 2.  Aortic Atherosclerosis (ICD10-I70.0). Electronically Signed   By: Kerby Moors M.D.   On: 10/30/2018 07:27   Vas [place] Lower Extremity Venous (dvt)  Result Date: 10/29/2018  Lower Venous Study Indications: Edema. Other Indications: Suspected Pancreatic cancer with metastasis.  Limitations: Body habitus. Comparison Study: No comparison study available. Performing Technologist: Rudell Cobb  Examination Guidelines: A complete evaluation includes B-mode imaging, spectral Doppler, color Doppler, and power Doppler as needed of all accessible portions of each vessel. Bilateral testing is considered an integral part of a complete examination. Limited examinations for reoccurring indications may be performed as noted.  Right Venous Findings: +---------+---------------+---------+-----------+----------+-------+          CompressibilityPhasicitySpontaneityPropertiesSummary  +---------+---------------+---------+-----------+----------+-------+ CFV      Full           Yes      Yes                          +---------+---------------+---------+-----------+----------+-------+ SFJ      Full                                                 +---------+---------------+---------+-----------+----------+-------+ FV Prox  Full                                                 +---------+---------------+---------+-----------+----------+-------+ FV Mid   Full                                                 +---------+---------------+---------+-----------+----------+-------+ FV DistalFull                                                 +---------+---------------+---------+-----------+----------+-------+ PFV      Full                                                 +---------+---------------+---------+-----------+----------+-------+ POP      Full           Yes      Yes                          +---------+---------------+---------+-----------+----------+-------+ PTV      Full                                                 +---------+---------------+---------+-----------+----------+-------+ PERO     Full                                                 +---------+---------------+---------+-----------+----------+-------+  Left Venous Findings: +---------+---------------+---------+-----------+----------+-------+          CompressibilityPhasicitySpontaneityPropertiesSummary +---------+---------------+---------+-----------+----------+-------+ CFV      Full           Yes      Yes                          +---------+---------------+---------+-----------+----------+-------+  SFJ      Full                                                 +---------+---------------+---------+-----------+----------+-------+ FV Prox  Full                                                  +---------+---------------+---------+-----------+----------+-------+ FV Mid   Full                                                 +---------+---------------+---------+-----------+----------+-------+ FV DistalFull                                                 +---------+---------------+---------+-----------+----------+-------+ PFV      Full                                                 +---------+---------------+---------+-----------+----------+-------+ POP      Full           Yes      Yes                          +---------+---------------+---------+-----------+----------+-------+ PTV      Full                                                 +---------+---------------+---------+-----------+----------+-------+ PERO     Full                                                 +---------+---------------+---------+-----------+----------+-------+    Summary: Right: There is no evidence of deep vein thrombosis in the lower extremity. No cystic structure found in the popliteal fossa. Left: There is no evidence of deep vein thrombosis in the lower extremity. No cystic structure found in the popliteal fossa.  *See table(s) above for measurements and observations. Electronically signed by Servando Snare MD on 10/29/2018 at 1:30:41 PM.    Final     Assessment and Plan:   1. Pancreas cancer  CT abdomen/pelvis 10/27/2018 -3.2 x 3.9 x 4.2 cm complex hypodense/hypoenhancing mass in the distal body/tail of the pancreas.  Atrophy of the pancreatic tail.  2.0 x 2.0 cm rounded density adjacent to the tail of the pancreas in the region of the splenic hilum consistent with a satellite lesion/metastasis; multiple (greater than 20) hypodense lesions throughout the liver measuring up to 2.5 x 3.0 cm in the right lobe of the liver most consistent with metastatic disease; 9 mm indeterminate left adrenal nodule.  CT chest 10/28/2018-8 mm slightly spiculated nodule in the left upper lobe, 11 mm  slightly spiculated nodule in superior segment right lower lobe.    Biopsy right liver lesion 11/02/2018-poorly differentiated carcinoma; strong positivity with cytokeratin AE1/AE3 and cytokeratin 7; negative for cytokeratin 20, cytokeratin 5/6, CDX2, Napsin A, TTF-1, ER, PR, GATA3, GCDFP, MOC-31, WT-1, desmin, smooth muscle actin, melan-A, S0X-10 and S100. 2. Right shoulder and right abdominal pain likely secondary to #1 3. Fever, probable tumor fever secondary to #1 4. Hypertension-diltiazem, metoprolol, ramipril 5. Hypothyroidism-levothyroxine  Ashlee Weeks appears to have pancreas cancer metastatic to the liver and possibly lung.  Dr. Benay Spice reviewed the diagnosis, prognosis and treatment options with her and her family at today's visit.  They understand that no therapy will be curative.  She has a poor performance status.  Ashlee Weeks and her family understand that she is not a good candidate for chemotherapy given the performance status.  Dr. Gearldine Shown recommendation is for supportive/comfort care with a referral to the [facility] program.  Ashlee Weeks is in agreement.  We discussed CPR/ACLS.  She will be placed on NO CODE BLUE status.  For the pain a prescription will be sent to her pharmacy for hydrocodone 1 to 2 tablets every 6 hours as needed.  She is likely having tumor fever.  She will begin Aleve twice daily.  She will return for an appointment next week for reevaluation and to answer additional questions.  She will contact the office in the interim with any problems.  Patient seen with Dr. Benay Spice.  Ned Card, NP 11/08/2018, 10:47 AM   This was a shared visit with Ned Card.  Ashlee Weeks was interviewed and examined.  CT images reviewed with Ashlee Weeks and her family.  She has been diagnosed with metastatic pancreas cancer.  We discussed the prognosis and treatment options.  Ms. Richardson has a poor performance status, ECOG #3.  She understands no therapy will be  curative.  I do not recommend chemotherapy secondary to her poor performance status.  She is in agreement.  We discussed Hospice care.  She agrees to a [facility] hospice referral and no CODE BLUE status.  She will begin twice daily naproxen for the tumor fever.  We prescribed hydrocodone to use as needed for pain.  She will return for an office visit and further discussion in approximately 1 week.

## 2018-11-08 ENCOUNTER — Inpatient Hospital Stay: Attending: Nurse Practitioner | Admitting: Nurse Practitioner

## 2018-11-08 ENCOUNTER — Telehealth: Payer: Self-pay

## 2018-11-08 ENCOUNTER — Inpatient Hospital Stay

## 2018-11-08 ENCOUNTER — Encounter: Payer: Self-pay | Admitting: Nurse Practitioner

## 2018-11-08 VITALS — BP 121/52 | HR 92 | Temp 98.9°F | Resp 19 | Ht 63.0 in | Wt 167.6 lb

## 2018-11-08 DIAGNOSIS — M25511 Pain in right shoulder: Secondary | ICD-10-CM | POA: Insufficient documentation

## 2018-11-08 DIAGNOSIS — C787 Secondary malignant neoplasm of liver and intrahepatic bile duct: Secondary | ICD-10-CM | POA: Insufficient documentation

## 2018-11-08 DIAGNOSIS — R109 Unspecified abdominal pain: Secondary | ICD-10-CM | POA: Insufficient documentation

## 2018-11-08 DIAGNOSIS — R509 Fever, unspecified: Secondary | ICD-10-CM | POA: Diagnosis not present

## 2018-11-08 DIAGNOSIS — E279 Disorder of adrenal gland, unspecified: Secondary | ICD-10-CM | POA: Diagnosis not present

## 2018-11-08 DIAGNOSIS — C252 Malignant neoplasm of tail of pancreas: Secondary | ICD-10-CM | POA: Diagnosis not present

## 2018-11-08 DIAGNOSIS — C259 Malignant neoplasm of pancreas, unspecified: Secondary | ICD-10-CM

## 2018-11-08 DIAGNOSIS — Z9981 Dependence on supplemental oxygen: Secondary | ICD-10-CM | POA: Diagnosis not present

## 2018-11-08 DIAGNOSIS — I10 Essential (primary) hypertension: Secondary | ICD-10-CM | POA: Insufficient documentation

## 2018-11-08 DIAGNOSIS — R911 Solitary pulmonary nodule: Secondary | ICD-10-CM | POA: Diagnosis not present

## 2018-11-08 DIAGNOSIS — E039 Hypothyroidism, unspecified: Secondary | ICD-10-CM | POA: Insufficient documentation

## 2018-11-08 LAB — URINALYSIS, COMPLETE (UACMP) WITH MICROSCOPIC
Bacteria, UA: NONE SEEN
Bilirubin Urine: NEGATIVE
GLUCOSE, UA: NEGATIVE mg/dL
Hgb urine dipstick: NEGATIVE
Ketones, ur: NEGATIVE mg/dL
Nitrite: NEGATIVE
Protein, ur: NEGATIVE mg/dL
SPECIFIC GRAVITY, URINE: 1.02 (ref 1.005–1.030)
WBC, UA: >50 WBC/hpf — ABNORMAL HIGH (ref 0–5)
pH: 6 (ref 5.0–8.0)

## 2018-11-08 MED ORDER — HYDROCODONE-ACETAMINOPHEN 5-325 MG PO TABS: 1.0000 | ORAL_TABLET | Freq: Four times a day (QID) | ORAL | 0 refills | Status: DC | PRN

## 2018-11-08 NOTE — Progress Notes (Signed)
  Oncology Nurse Navigator Documentation  Met with Ashlee Weeks and adult son and daughter during initial consult. Provided written materials from [facility] on Pancreas cancer and hospice. Provided contact information for treatment team. Encouraged family to call with questions or concerns. Patient to transition to Hospice care.

## 2018-11-08 NOTE — Telephone Encounter (Signed)
TC to pts daughter([phone]) informing her of her mothers lab appointment and time tomorrow 11/09/18 at 12:45pm. Daughter verbalized understanding. Kim at lab made aware that lab needs to be sent to pathology after being drawn.

## 2018-11-08 NOTE — Telephone Encounter (Signed)
TC per Lattie Haw to [facility] [place] [phone]) to make a referral to the [facility] program, NO CODE BLUE, Dr. Benay Spice is the attending. referral is complete.

## 2018-11-08 NOTE — Telephone Encounter (Signed)
Printed avs and calender of upcoming appointment. Per 2/4 los 

## 2018-11-08 NOTE — Telephone Encounter (Signed)
TC per Lattie Haw to [facility] pathology [phone])  to request MSI and BRCA testing on biopsy 11/02/2018. [phone]). Request complete.

## 2018-11-09 ENCOUNTER — Telehealth: Payer: Self-pay

## 2018-11-09 ENCOUNTER — Telehealth: Payer: Self-pay | Admitting: *Deleted

## 2018-11-09 ENCOUNTER — Other Ambulatory Visit: Payer: Medicare Other

## 2018-11-09 ENCOUNTER — Other Ambulatory Visit: Payer: Self-pay | Admitting: Nurse Practitioner

## 2018-11-09 DIAGNOSIS — C259 Malignant neoplasm of pancreas, unspecified: Secondary | ICD-10-CM

## 2018-11-09 DIAGNOSIS — C787 Secondary malignant neoplasm of liver and intrahepatic bile duct: Principal | ICD-10-CM

## 2018-11-09 LAB — URINE CULTURE: Culture: <10000 — AB

## 2018-11-09 MED ORDER — TRAMADOL HCL 50 MG PO TABS: 50.0000 mg | ORAL_TABLET | Freq: Four times a day (QID) | ORAL | 0 refills | Status: AC | PRN

## 2018-11-09 MED ORDER — CIPROFLOXACIN HCL 500 MG PO TABS: 500.0000 mg | ORAL_TABLET | Freq: Two times a day (BID) | ORAL | 0 refills | Status: AC

## 2018-11-09 NOTE — Telephone Encounter (Signed)
TC per Lattie Haw to pt to let her know the urinalysis is consistent with infection and a prescription for ciprofloxacin has been sent to her pharmacy ([facility]). Pt verbalized understanding. No further problems or concerns at this time.

## 2018-11-09 NOTE — Telephone Encounter (Signed)
"  Ashlee Weeks calling for [name] who needs Tramadol refill.  She received Hydrocodone yesterday but Tramadol won't make her as sleepy.  Please call me [phone]."

## 2018-11-09 NOTE — Telephone Encounter (Signed)
Returned TC to pt's daughter Fontella Shan [phone]) in regard to Tramadol medication refill request. Lattie Haw Aware of refill request. Refill request sent to pharmacy. Daughter verbalized understanding. No further problems or concerns at this time.

## 2018-11-09 NOTE — Telephone Encounter (Signed)
PA started on CoverMyMeds KEY:  [id number]

## 2018-11-09 NOTE — Telephone Encounter (Signed)
approved through 10/05/2019 

## 2018-11-11 ENCOUNTER — Other Ambulatory Visit: Payer: Self-pay | Admitting: Internal Medicine

## 2018-11-18 ENCOUNTER — Ambulatory Visit (HOSPITAL_COMMUNITY): Payer: Medicare Other

## 2018-11-18 ENCOUNTER — Inpatient Hospital Stay (HOSPITAL_BASED_OUTPATIENT_CLINIC_OR_DEPARTMENT_OTHER): Admitting: Oncology

## 2018-11-18 ENCOUNTER — Other Ambulatory Visit: Payer: Self-pay | Admitting: Internal Medicine

## 2018-11-18 ENCOUNTER — Telehealth: Payer: Self-pay | Admitting: Oncology

## 2018-11-18 ENCOUNTER — Other Ambulatory Visit: Payer: Self-pay | Admitting: *Deleted

## 2018-11-18 VITALS — BP 108/43 | HR 87 | Temp 98.1°F | Resp 19 | Ht 63.0 in | Wt 163.1 lb

## 2018-11-18 DIAGNOSIS — I10 Essential (primary) hypertension: Secondary | ICD-10-CM

## 2018-11-18 DIAGNOSIS — R911 Solitary pulmonary nodule: Secondary | ICD-10-CM

## 2018-11-18 DIAGNOSIS — E279 Disorder of adrenal gland, unspecified: Secondary | ICD-10-CM | POA: Diagnosis not present

## 2018-11-18 DIAGNOSIS — C787 Secondary malignant neoplasm of liver and intrahepatic bile duct: Secondary | ICD-10-CM | POA: Diagnosis not present

## 2018-11-18 DIAGNOSIS — C252 Malignant neoplasm of tail of pancreas: Secondary | ICD-10-CM | POA: Diagnosis not present

## 2018-11-18 DIAGNOSIS — C259 Malignant neoplasm of pancreas, unspecified: Secondary | ICD-10-CM

## 2018-11-18 DIAGNOSIS — R61 Generalized hyperhidrosis: Secondary | ICD-10-CM

## 2018-11-18 MED ORDER — FLUCONAZOLE 100 MG PO TABS: 100.0000 mg | ORAL_TABLET | Freq: Every day | ORAL | 0 refills | Status: AC

## 2018-11-18 NOTE — Telephone Encounter (Signed)
Scheduled appt per 02/14 los.  Declined avs and calendar.

## 2018-11-18 NOTE — Progress Notes (Signed)
  Ashlee Weeks   Diagnosis: Pancreas cancer  INTERVAL HISTORY:   Ms. Ashlee Weeks returns for scheduled visit.  She has enrolled in the [facility] hospice program.  She reports this is been helpful.  She is here today with her son.  She has not tried hydrocodone for pain.  She has also not started naproxen.  She has night sweats, but no fever.  Objective:  Vital signs in last 24 hours:  Blood pressure (!) 108/43, pulse 87, temperature 98.1 F (36.7 C), temperature source Oral, resp. rate 19, height 5\' 3"  (1.6 m), weight 163 lb 1.6 oz (74 kg), SpO2 96 %.    HEENT: Thick white coat over the tongue, no buccal thrush Resp: Lungs clear bilaterally Cardio: Regular rate and rhythm GI: No hepatosplenomegaly, no mass nontender Vascular: No leg edema   Lab Results:  Lab Results  Component Value Date   WBC 28.6 (H) 11/02/2018   HGB 10.0 (L) 11/02/2018   HCT 33.2 (L) 11/02/2018   MCV 91.5 11/02/2018   PLT 465 (H) 11/02/2018   NEUTROABS 24.4 (H) 11/02/2018    CMP  Lab Results  Component Value Date   NA 136 11/02/2018   K 4.7 11/02/2018   CL 96 (L) 11/02/2018   CO2 29 11/02/2018   GLUCOSE 125 (H) 11/02/2018   BUN 13 11/02/2018   CREATININE 0.82 11/02/2018   CALCIUM 8.6 (L) 11/02/2018   PROT 6.9 11/02/2018   ALBUMIN 2.8 (L) 11/02/2018   AST 31 11/02/2018   ALT 22 11/02/2018   ALKPHOS 171 (H) 11/02/2018   BILITOT 1.0 11/02/2018   GFRNONAA >60 11/02/2018   GFRAA >60 11/02/2018     Medications: I have reviewed the patient's current medications.   Assessment/Plan: 1. Pancreas cancer  CT abdomen/pelvis 10/27/2018 -3.2 x 3.9 x 4.2 cm complex hypodense/hypoenhancing mass in the distal body/tail of the pancreas.  Atrophy of the pancreatic tail.  2.0 x 2.0 cm rounded density adjacent to the tail of the pancreas in the region of the splenic hilum consistent with a satellite lesion/metastasis; multiple (greater than 20) hypodense lesions throughout  the liver measuring up to 2.5 x 3.0 cm in the right lobe of the liver most consistent with metastatic disease; 9 mm indeterminate left adrenal nodule.    CT chest 10/28/2018-8 mm slightly spiculated nodule in the left upper lobe, 11 mm slightly spiculated nodule in superior segment right lower lobe.    Biopsy right liver lesion 11/02/2018-poorly differentiated carcinoma; strong positivity with cytokeratin AE1/AE3 and cytokeratin 7; negative for cytokeratin 20, cytokeratin 5/6, CDX2, Napsin A, TTF-1, ER, PR, GATA3, GCDFP, MOC-31, WT-1, desmin, smooth muscle actin, melan-A, S0X-10 and S100. 2. Right shoulder and right abdominal pain likely secondary to #1 3. Fever, probable tumor fever secondary to #1 4. Hypertension-diltiazem, metoprolol, ramipril 5. Hypothyroidism-levothyroxine    Disposition: Ashlee Weeks has metastatic pancreas cancer.  She has enrolled in the [facility] hospice program.  We consolidated her medical regimen today by eliminating nonessential medications.  She will continue follow-up with the home hospice program and return for an office visit in 3 weeks.  We are available to see her sooner as needed.  Betsy Coder, MD  11/18/2018  3:41 PM

## 2018-11-30 ENCOUNTER — Telehealth: Payer: Self-pay | Admitting: *Deleted

## 2018-11-30 NOTE — Telephone Encounter (Signed)
Patient is asking if Dr. Benay Spice would consider her taking an antibiotic for UTI prevention?

## 2018-12-01 NOTE — Telephone Encounter (Signed)
Informed nurse that Dr. Benay Spice would not begin long term antibiotic for UTI prevention at this time.

## 2018-12-05 ENCOUNTER — Telehealth: Payer: Self-pay | Admitting: *Deleted

## 2018-12-05 NOTE — Telephone Encounter (Addendum)
Patient has thrush in her mouth. Asking if OK w/MD to call in Nystatin suspension? Per Dr. Benay Spice: OK to call in nystatin suspension. Left this on nurse VM.

## 2018-12-09 ENCOUNTER — Inpatient Hospital Stay: Payer: Medicare Other | Admitting: Nurse Practitioner

## 2018-12-12 ENCOUNTER — Telehealth: Payer: Self-pay | Admitting: *Deleted

## 2018-12-12 ENCOUNTER — Other Ambulatory Visit: Payer: Self-pay | Admitting: Oncology

## 2018-12-12 DIAGNOSIS — C787 Secondary malignant neoplasm of liver and intrahepatic bile duct: Principal | ICD-10-CM

## 2018-12-12 DIAGNOSIS — C259 Malignant neoplasm of pancreas, unspecified: Secondary | ICD-10-CM

## 2018-12-12 MED ORDER — HYDROCODONE-ACETAMINOPHEN 5-325 MG PO TABS: 1.0000 | ORAL_TABLET | Freq: Four times a day (QID) | ORAL | 0 refills | Status: AC | PRN

## 2018-12-12 NOTE — Telephone Encounter (Addendum)
Need refill on hydrocodone/apap. Confirmed pharmacy.                Confirmed pharmacy as [facility] on [address].

## 2019-01-04 DEATH — deceased

## 2019-01-26 ENCOUNTER — Other Ambulatory Visit: Payer: Self-pay | Admitting: Internal Medicine

## 2019-01-26 DIAGNOSIS — I10 Essential (primary) hypertension: Secondary | ICD-10-CM

## 2019-07-24 NOTE — Progress Notes (Deleted)
Subjective:   Ashlee Weeks is a 84 y.o. female who presents for Medicare Annual (Subsequent) preventive examination.  Review of Systems:     Sleep patterns: {SX; SLEEP PATTERNS:18802::"feels rested on waking","does not get up to void","gets up *** times nightly to void","sleeps *** hours nightly"}.    Home Safety/Smoke Alarms: Feels safe in home. Smoke alarms in place.  Living environment; residence and Firearm Safety: {Rehab home environment / accessibility:30080::"no firearms","firearms stored safely"}. Seat Belt Safety/Bike Helmet: Wears seat belt.     Objective:     Vitals: There were no vitals taken for this visit.  There is no height or weight on file to calculate BMI.  Advanced Directives 11/02/2018 10/28/2018 10/27/2018 10/21/2018 07/20/2018 07/16/2017  Does Patient Have a Medical Advance Directive? No No - No Yes Yes  Type of Advance Directive - - - - Press photographer;Living will [place];Living will  Does patient want to make changes to medical advance directive? - - No - Patient declined Yes (ED - Information included in AVS) - -  Copy of [place] in Chart? - - - - No - copy requested No - copy requested  Would patient like information on creating a medical advance directive? No - Patient declined No - Patient declined - - - -    Tobacco Social History   Tobacco Use  Smoking Status Never Smoker  Smokeless Tobacco Never Used     Counseling given: Not Answered  Past Medical History:  Diagnosis Date  . Arthritis   . Blood in stool   . Diverticulitis   . GERD (gastroesophageal reflux disease)   . Hypertension   . Thyroid disease   . Urine incontinence    Past Surgical History:  Procedure Laterality Date  . APPENDECTOMY    . CHOLECYSTECTOMY    . EYE SURGERY Right 01/07/2016   artificial lens implant  . EYE SURGERY Left 01/23/2016   artificial lens implant  . REPLACEMENT TOTAL KNEE Bilateral   . THYROID  SURGERY     1/2 of thyroid removed  . TONSILLECTOMY AND ADENOIDECTOMY    . TOTAL SHOULDER REPLACEMENT Left 2016  . TUBAL LIGATION     Family History  Problem Relation Age of Onset  . Hypertension Mother   . Heart disease Father   . Hypertension Maternal Aunt   . Miscarriages / Stillbirths Maternal Aunt   . Arthritis Maternal Grandmother   . Alcohol abuse Paternal Grandmother    Social History   Socioeconomic History  . Marital status: Widowed    Spouse name: Not on file  . Number of children: 1  . Years of education: Not on file  . Highest education level: Not on file  Occupational History  . Not on file  Social Needs  . Financial resource strain: Not hard at all  . Food insecurity    Worry: Never true    Inability: Never true  . Transportation needs    Medical: No    Non-medical: No  Tobacco Use  . Smoking status: Never Smoker  . Smokeless tobacco: Never Used  Substance and Sexual Activity  . Alcohol use: No    Alcohol/week: 0.0 standard drinks  . Drug use: No  . Sexual activity: Not Currently  Lifestyle  . Physical activity    Days per week: 3 days    Minutes per session: 30 min  . Stress: Not at all  Relationships  . Social connections    Talks  on phone: More than three times a week    Gets together: More than three times a week    Attends religious service: More than 4 times per year    Active member of club or organization: Yes    Attends meetings of clubs or organizations: More than 4 times per year    Relationship status: Widowed  Other Topics Concern  . Not on file  Social History Narrative  . Not on file    Outpatient Encounter Medications as of 07/25/2019  Medication Sig  . diclofenac sodium (VOLTAREN) 1 % GEL Apply 4 g topically 4 (four) times daily. (Patient not taking: Reported on 11/08/2018)  . HYDROcodone-acetaminophen (NORCO) 5-325 MG tablet Take 1-2 tablets by mouth every 6 (six) hours as needed for moderate pain.  Marland Kitchen ibuprofen  (ADVIL,MOTRIN) 200 MG tablet Take 400 mg by mouth daily as needed for moderate pain.   Marland Kitchen levothyroxine (SYNTHROID, LEVOTHROID) 25 MCG tablet Take 1 tablet (25 mcg total) by mouth daily before breakfast.  . lidocaine (LIDODERM) 5 % Place 1 patch onto the skin daily. Remove & Discard patch within 12 hours or as directed by MD (Patient not taking: Reported on 11/08/2018)  . metoprolol tartrate (LOPRESSOR) 50 MG tablet TAKE 1 TABLET (50 MG TOTAL) BY MOUTH 2 (TWO) TIMES DAILY. -- OFFICE VISIT NEEDED FOR FURTHER REFILLS  . omeprazole (PRILOSEC) 20 MG capsule Take 1 capsule (20 mg total) by mouth daily.  . traMADol (ULTRAM) 50 MG tablet Take 1 tablet (50 mg total) by mouth every 6 (six) hours as needed.   No facility-administered encounter medications on file as of 07/25/2019.     Activities of Daily Living In your present state of health, do you have any difficulty performing the following activities: 10/28/2018  Hearing? N  Vision? N  Difficulty concentrating or making decisions? N  Walking or climbing stairs? N  Dressing or bathing? N  Doing errands, shopping? N  Some recent data might be hidden    Patient Care Team: Hoyt Koch, MD as PCP - General (Internal Medicine)    Assessment:   This is a routine wellness examination for Ashlee Weeks. Physical assessment deferred to PCP.   Exercise Activities and Dietary recommendations   Diet (meal preparation, eat out, water intake, caffeinated beverages, dairy products, fruits and vegetables): {Desc; diets:16563}   Goals    . Patient Stated     I want to increase physical activity by walking more and watching what I eat.     . Stay as healthy as I can     Continue to walk, eat healthy, stay informed, enjoy life and family,        Fall Risk Fall Risk  07/20/2018 07/16/2017 03/29/2017 10/09/2015  Falls in the past year? No No No No   Is the patient's home free of loose throw rugs in walkways, pet beds, electrical cords, etc?   {Blank  single:19197::"yes","no"}      Grab bars in the bathroom? {Blank single:19197::"yes","no"}      Handrails on the stairs?   {Blank single:19197::"yes","no"}      Adequate lighting?   {Blank single:19197::"yes","no"}  Depression Screen PHQ 2/9 Scores 07/20/2018 07/16/2017 03/29/2017 10/09/2015  PHQ - 2 Score 0 0 0 0  PHQ- 9 Score - 0 - -     Cognitive Function MMSE - Mini Mental State Exam 07/20/2018 07/16/2017  Orientation to time 5 5  Orientation to Place 5 5  Registration 3 3  Attention/ Calculation 5 5  Recall 2 2  Language- name 2 objects 2 2  Language- repeat 1 1  Language- follow 3 step command 3 3  Language- read & follow direction 1 1  Write a sentence 1 1  Copy design 1 1  Total score 29 29        Immunization History  Administered Date(s) Administered  . Influenza, High Dose Seasonal PF 07/30/2016, 07/16/2017, 07/20/2018  . Influenza,inj,Quad PF,6+ Mos 10/09/2015  . Pneumococcal Conjugate-13 10/09/2015  . Pneumococcal Polysaccharide-23 10/05/2004  . Tdap 01/30/2016    Screening Tests Health Maintenance  Topic Date Due  . INFLUENZA VACCINE  05/06/2019  . TETANUS/TDAP  01/29/2026  . DEXA SCAN  Completed  . PNA vac Low Risk Adult  Completed      Plan:     I have personally reviewed and noted the following in the patient's chart:   . Medical and social history . Use of alcohol, tobacco or illicit drugs  . Current medications and supplements . Functional ability and status . Nutritional status . Physical activity . Advanced directives . List of other physicians . Vitals . Screenings to include cognitive, depression, and falls . Referrals and appointments  In addition, I have reviewed and discussed with patient certain preventive protocols, quality metrics, and best practice recommendations. A written personalized care plan for preventive services as well as general preventive health recommendations were provided to patient.     Michiel Cowboy, RN   07/24/2019

## 2019-07-25 ENCOUNTER — Ambulatory Visit: Payer: Medicare Other

## 2019-12-07 IMAGING — CT CT ANGIO CHEST
2 of 6 series · 19 of 46 positions shown · IV contrast (ISOVUE)
Comparison: Chest radiography 10/27/2018

CLINICAL DATA: New diagnosis of pancreatic cancer with metastatic
disease to the liver yesterday.

EXAM:
CT ANGIOGRAPHY CHEST WITH CONTRAST
TECHNIQUE: Multidetector CT imaging of the chest was performed using the
standard protocol during bolus administration of intravenous
contrast. Multiplanar CT image reconstructions and MIPs were
obtained to evaluate the vascular anatomy.
CONTRAST:  100mL [id number] IOPAMIDOL ([id number]) INJECTION 76%

[Series 5: thins · axial · 0.52mm/px · z∈[-123,+128]mm · 16 of 275 slices shown]
[im 12/275  lung]
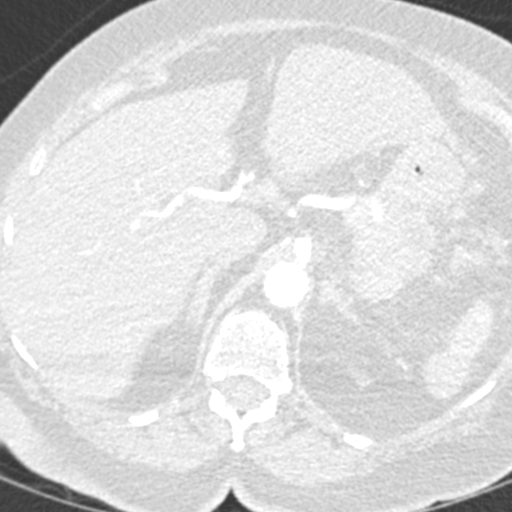
[im 36/275  soft-tissue]
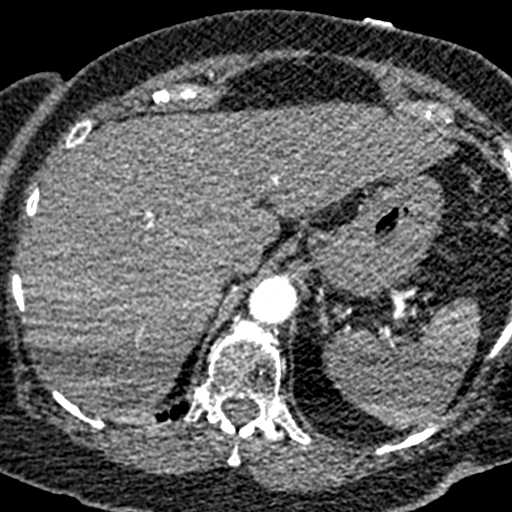
[im 48/275  lung]
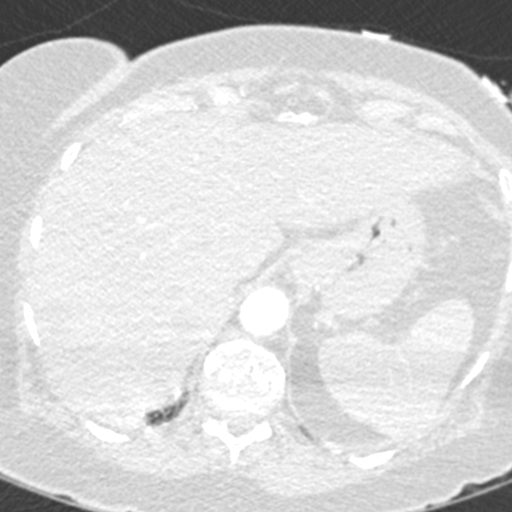
[im 60/275  soft-tissue]
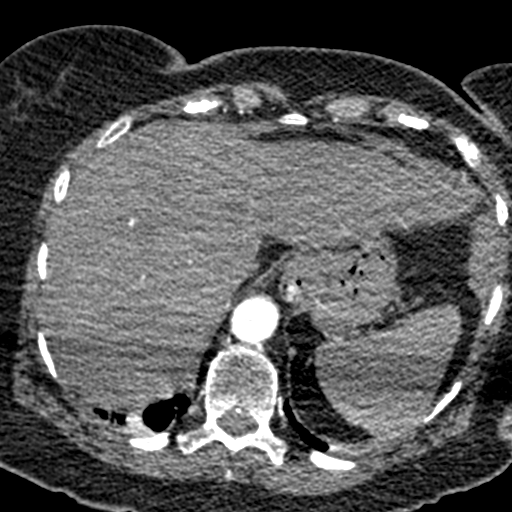
[im 84/275  lung]
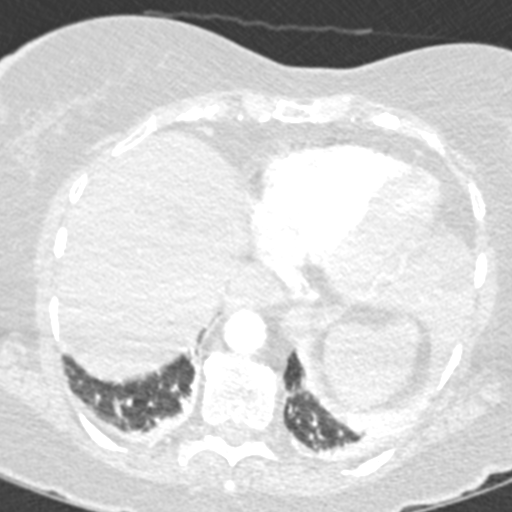
[im 96/275  soft-tissue]
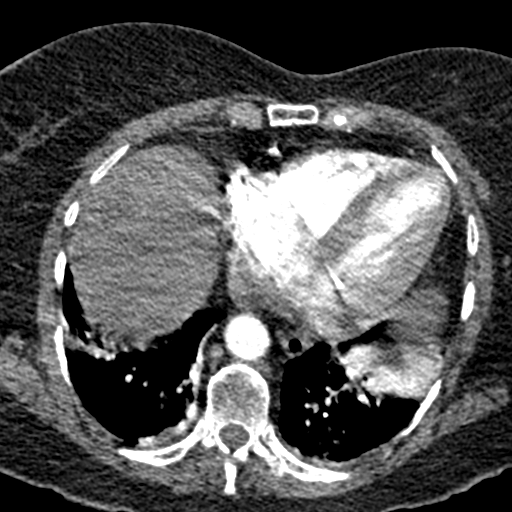
[im 108/275  lung]
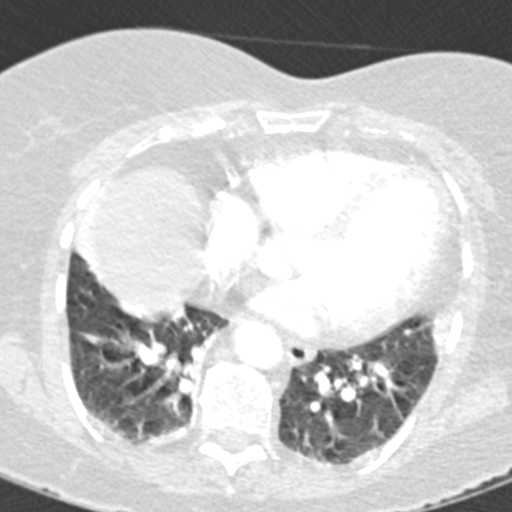
[im 132/275  soft-tissue]
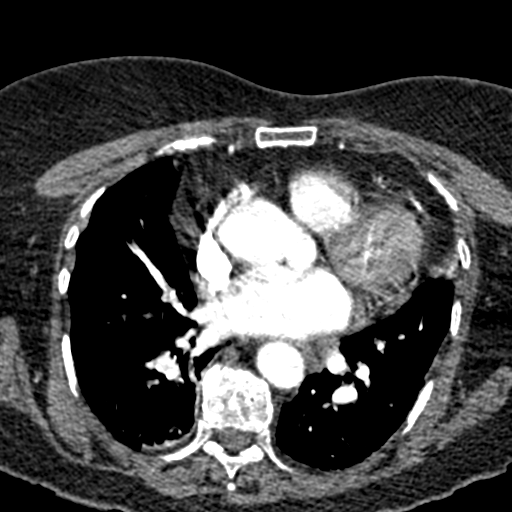
[im 143/275  lung]
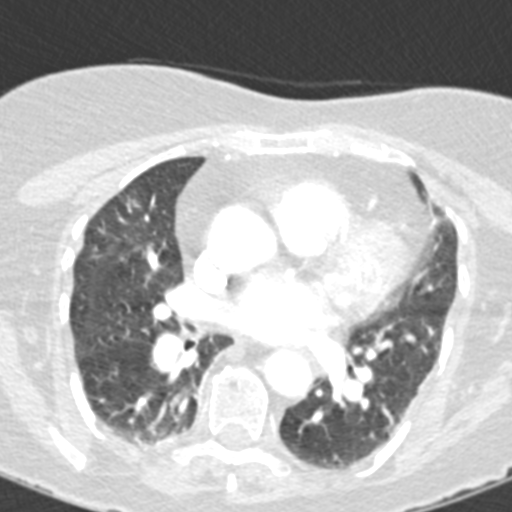
[im 167/275  soft-tissue]
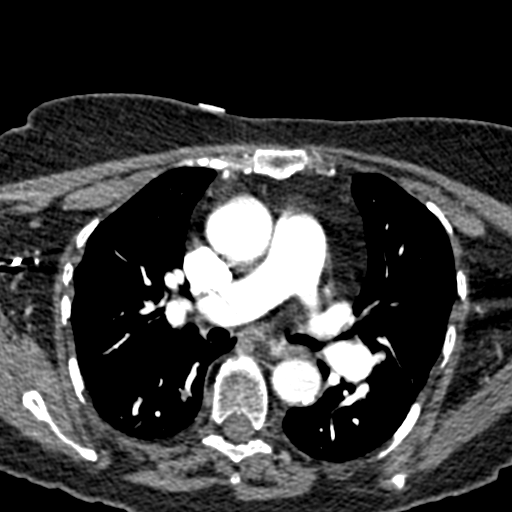
[im 179/275  lung]
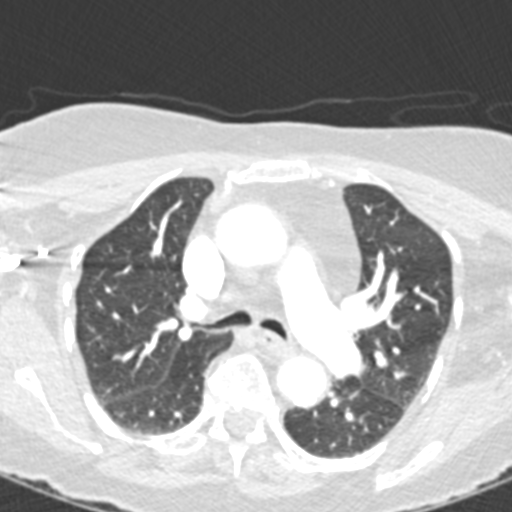
[im 191/275  soft-tissue]
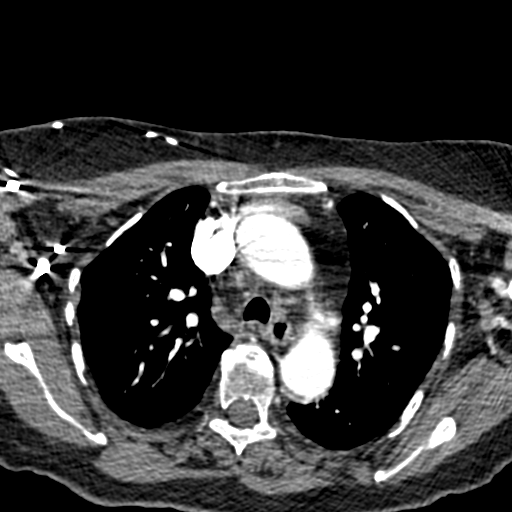
[im 215/275  lung]
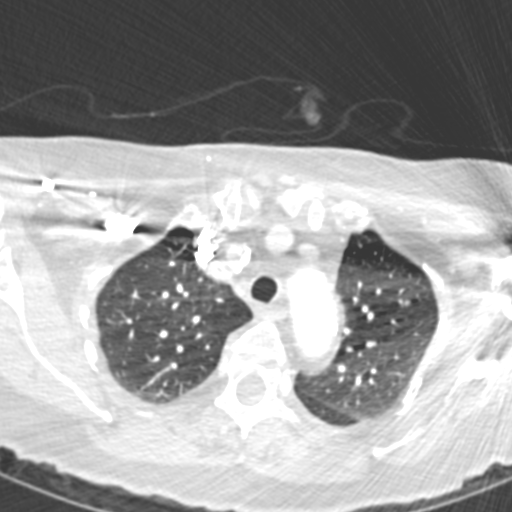
[im 227/275  soft-tissue]
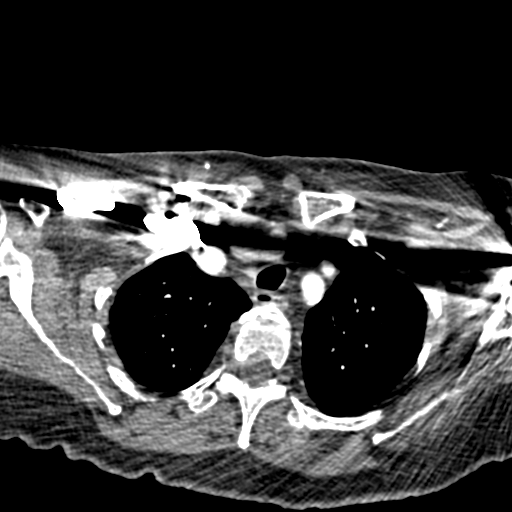
[im 239/275  lung]
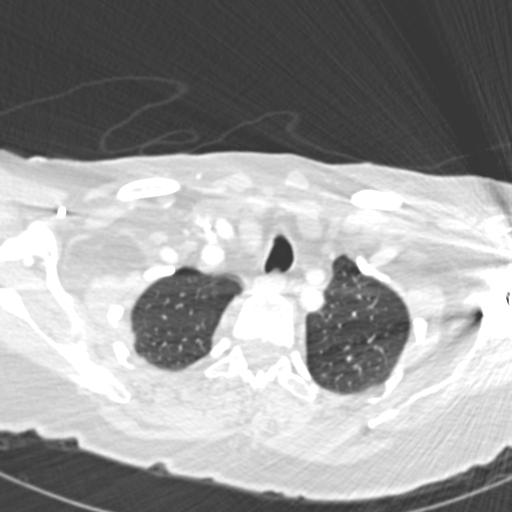
[im 263/275  soft-tissue]
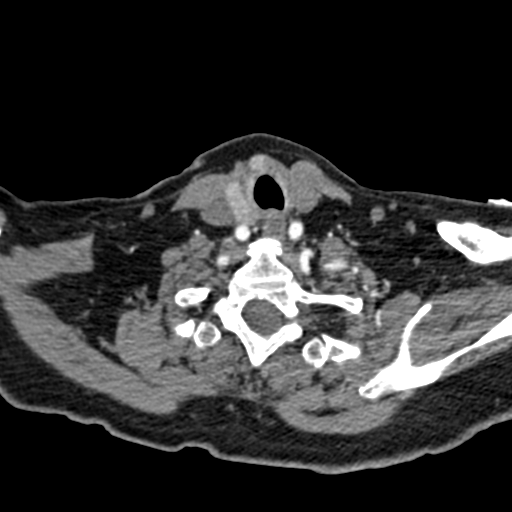

[Series 7: coronal mpr · coronal · 0.57mm/px · 3 of 128 slices shown]
[im 32/128  soft-tissue]
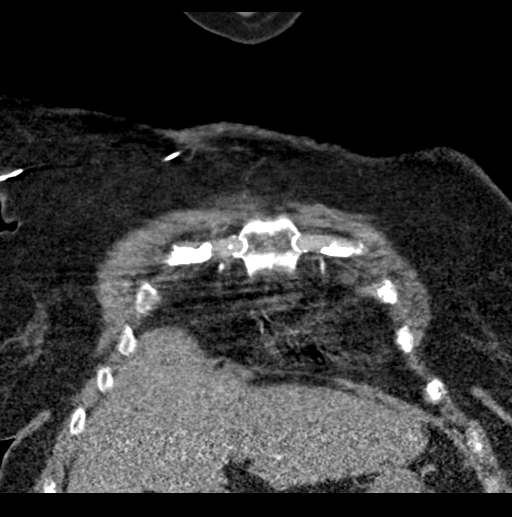
[im 64/128  soft-tissue]
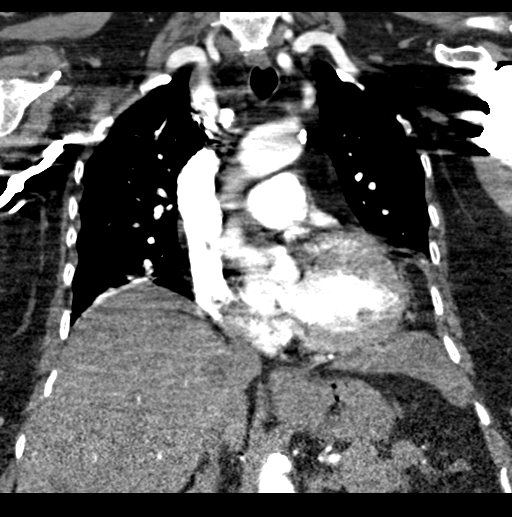
[im 96/128  soft-tissue]
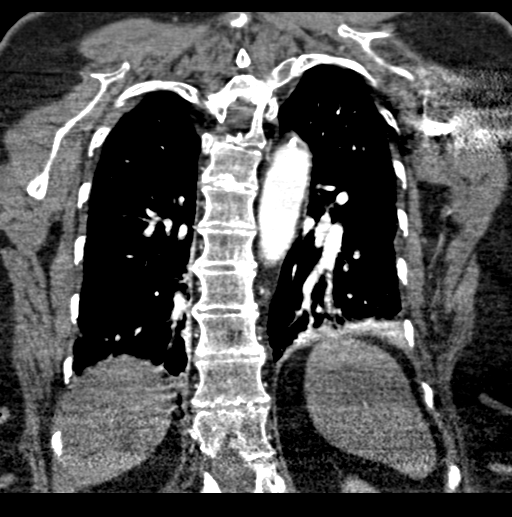

[19 of 46 positions shown; findings below may reference images not displayed]

FINDINGS: Cardiovascular: Pulmonary arterial opacification is good. No visible
pulmonary emboli. The study shows some motion degradation. There is
aortic atherosclerosis without aneurysm or dissection. There is
coronary artery calcification. Heart size is normal.

Mediastinum/Nodes: No mediastinal or hilar mass or lymphadenopathy.

Lungs/Pleura: There is mild bilateral lower lobe atelectasis. There
is no pleural effusion. There is an 8 mm slightly spiculated nodule
in the left upper lobe image 38. There is an 11 mm slightly
spiculated nodule in the superior segment right lower lobe image 52.
In this setting these are worrisome for pulmonary metastases.

Upper Abdomen: See results of scan yesterday.

Musculoskeletal: No lytic lesions seen. Ordinary spinal degenerative
change.

Review of the MIP images confirms the above findings.
IMPRESSION: 1. No pulmonary emboli.
2. 8 mm slightly spiculated nodule in the left upper lobe. 11 mm
slightly spiculated nodule in the superior segment right lower lobe.
These are worrisome for pulmonary metastases.
3. Coronary artery calcification.

Aortic Atherosclerosis (ACQQ6-QTI.I).
